# Patient Record
Sex: Female | Born: 1965
Health system: Southern US, Community
[De-identification: ages and names within clinical notes are randomized; demographics above are authoritative.]

## PROBLEM LIST (undated history)

## (undated) DIAGNOSIS — L723 Sebaceous cyst: Secondary | ICD-10-CM

## (undated) DIAGNOSIS — M1990 Unspecified osteoarthritis, unspecified site: Secondary | ICD-10-CM

## (undated) DIAGNOSIS — K5730 Diverticulosis of large intestine without perforation or abscess without bleeding: Secondary | ICD-10-CM

## (undated) DIAGNOSIS — E079 Disorder of thyroid, unspecified: Secondary | ICD-10-CM

## (undated) DIAGNOSIS — I10 Essential (primary) hypertension: Secondary | ICD-10-CM

## (undated) DIAGNOSIS — Z8249 Family history of ischemic heart disease and other diseases of the circulatory system: Secondary | ICD-10-CM

## (undated) DIAGNOSIS — F329 Major depressive disorder, single episode, unspecified: Secondary | ICD-10-CM

## (undated) DIAGNOSIS — T8859XA Other complications of anesthesia, initial encounter: Secondary | ICD-10-CM

## (undated) DIAGNOSIS — E890 Postprocedural hypothyroidism: Secondary | ICD-10-CM

## (undated) DIAGNOSIS — J4540 Moderate persistent asthma, uncomplicated: Secondary | ICD-10-CM

## (undated) DIAGNOSIS — K649 Unspecified hemorrhoids: Secondary | ICD-10-CM

## (undated) DIAGNOSIS — D509 Iron deficiency anemia, unspecified: Secondary | ICD-10-CM

## (undated) DIAGNOSIS — Z973 Presence of spectacles and contact lenses: Secondary | ICD-10-CM

## (undated) DIAGNOSIS — F259 Schizoaffective disorder, unspecified: Secondary | ICD-10-CM

## (undated) DIAGNOSIS — K219 Gastro-esophageal reflux disease without esophagitis: Secondary | ICD-10-CM

## (undated) DIAGNOSIS — J45909 Unspecified asthma, uncomplicated: Secondary | ICD-10-CM

## (undated) DIAGNOSIS — F411 Generalized anxiety disorder: Secondary | ICD-10-CM

## (undated) DIAGNOSIS — E039 Hypothyroidism, unspecified: Secondary | ICD-10-CM

## (undated) HISTORY — PX: SHOULDER SURGERY: SHX246

## (undated) HISTORY — PX: BREAST SURGERY: SHX581

## (undated) HISTORY — PX: BREAST REDUCTION SURGERY: SHX8

## (undated) HISTORY — PX: COLONOSCOPY: SHX174

## (undated) HISTORY — PX: COSMETIC SURGERY: SHX468

## (undated) HISTORY — PX: HERNIA REPAIR: SHX51

## (undated) HISTORY — PX: REDUCTION MAMMAPLASTY: SUR839

## (undated) HISTORY — PX: SHOULDER ARTHROSCOPY: SHX128

## (undated) HISTORY — PX: KNEE ARTHROSCOPY: SUR90

---

## 1986-08-11 HISTORY — PX: BREAST LUMPECTOMY: SHX2

## 1996-08-11 HISTORY — PX: BREAST REDUCTION SURGERY: SHX8

## 1998-05-16 ENCOUNTER — Other Ambulatory Visit: Admission: RE | Admit: 1998-05-16 | Discharge: 1998-05-16 | Payer: Self-pay | Admitting: Family Medicine

## 1999-07-23 ENCOUNTER — Other Ambulatory Visit: Admission: RE | Admit: 1999-07-23 | Discharge: 1999-07-23 | Payer: Self-pay | Admitting: Family Medicine

## 1999-10-14 ENCOUNTER — Ambulatory Visit (HOSPITAL_COMMUNITY): Admission: RE | Admit: 1999-10-14 | Discharge: 1999-10-14 | Payer: Self-pay | Admitting: Family Medicine

## 1999-10-14 ENCOUNTER — Encounter: Payer: Self-pay | Admitting: Family Medicine

## 2000-01-01 ENCOUNTER — Ambulatory Visit (HOSPITAL_COMMUNITY): Admission: RE | Admit: 2000-01-01 | Discharge: 2000-01-01 | Payer: Self-pay | Admitting: *Deleted

## 2000-01-01 ENCOUNTER — Encounter: Payer: Self-pay | Admitting: Orthopedic Surgery

## 2000-01-17 ENCOUNTER — Ambulatory Visit (HOSPITAL_COMMUNITY): Admission: RE | Admit: 2000-01-17 | Discharge: 2000-01-17 | Payer: Self-pay | Admitting: Orthopedic Surgery

## 2000-01-22 ENCOUNTER — Ambulatory Visit (HOSPITAL_COMMUNITY): Admission: RE | Admit: 2000-01-22 | Discharge: 2000-01-22 | Payer: Self-pay | Admitting: Orthopedic Surgery

## 2000-01-22 ENCOUNTER — Encounter: Payer: Self-pay | Admitting: Orthopedic Surgery

## 2000-02-06 ENCOUNTER — Encounter: Payer: Self-pay | Admitting: Internal Medicine

## 2000-02-06 ENCOUNTER — Ambulatory Visit (HOSPITAL_COMMUNITY): Admission: RE | Admit: 2000-02-06 | Discharge: 2000-02-06 | Payer: Self-pay | Admitting: Internal Medicine

## 2000-03-25 ENCOUNTER — Ambulatory Visit (HOSPITAL_COMMUNITY)
Admission: RE | Admit: 2000-03-25 | Discharge: 2000-03-25 | Payer: Self-pay | Admitting: Physical Medicine and Rehabilitation

## 2000-03-25 ENCOUNTER — Encounter: Payer: Self-pay | Admitting: Physical Medicine and Rehabilitation

## 2000-12-17 ENCOUNTER — Encounter: Payer: Self-pay | Admitting: Emergency Medicine

## 2000-12-17 ENCOUNTER — Emergency Department (HOSPITAL_COMMUNITY): Admission: EM | Admit: 2000-12-17 | Discharge: 2000-12-18 | Payer: Self-pay | Admitting: Emergency Medicine

## 2001-03-09 ENCOUNTER — Encounter: Admission: RE | Admit: 2001-03-09 | Discharge: 2001-03-09 | Payer: Self-pay | Admitting: Family Medicine

## 2001-03-09 ENCOUNTER — Encounter: Payer: Self-pay | Admitting: Family Medicine

## 2001-08-11 DIAGNOSIS — Z8639 Personal history of other endocrine, nutritional and metabolic disease: Secondary | ICD-10-CM

## 2001-08-11 HISTORY — DX: Personal history of other endocrine, nutritional and metabolic disease: Z86.39

## 2002-04-27 ENCOUNTER — Encounter: Payer: Self-pay | Admitting: Emergency Medicine

## 2002-04-27 ENCOUNTER — Emergency Department (HOSPITAL_COMMUNITY): Admission: EM | Admit: 2002-04-27 | Discharge: 2002-04-27 | Payer: Self-pay | Admitting: *Deleted

## 2002-07-19 ENCOUNTER — Encounter (HOSPITAL_COMMUNITY): Admission: RE | Admit: 2002-07-19 | Discharge: 2002-10-17 | Payer: Self-pay | Admitting: Endocrinology

## 2002-07-20 ENCOUNTER — Encounter: Payer: Self-pay | Admitting: Endocrinology

## 2002-08-05 ENCOUNTER — Encounter: Payer: Self-pay | Admitting: Endocrinology

## 2004-09-16 ENCOUNTER — Encounter: Admission: RE | Admit: 2004-09-16 | Discharge: 2004-09-16 | Payer: Self-pay | Admitting: Family Medicine

## 2005-09-05 ENCOUNTER — Encounter: Admission: RE | Admit: 2005-09-05 | Discharge: 2005-09-05 | Payer: Self-pay | Admitting: Family Medicine

## 2005-09-05 ENCOUNTER — Other Ambulatory Visit: Admission: RE | Admit: 2005-09-05 | Discharge: 2005-09-05 | Payer: Self-pay | Admitting: Family Medicine

## 2005-09-19 ENCOUNTER — Emergency Department (HOSPITAL_COMMUNITY): Admission: EM | Admit: 2005-09-19 | Discharge: 2005-09-19 | Payer: Self-pay | Admitting: Emergency Medicine

## 2005-10-13 ENCOUNTER — Ambulatory Visit (HOSPITAL_COMMUNITY): Admission: RE | Admit: 2005-10-13 | Discharge: 2005-10-13 | Payer: Self-pay | Admitting: Family Medicine

## 2006-09-22 ENCOUNTER — Encounter: Admission: RE | Admit: 2006-09-22 | Discharge: 2006-09-22 | Payer: Self-pay | Admitting: Endocrinology

## 2006-10-05 ENCOUNTER — Other Ambulatory Visit: Admission: RE | Admit: 2006-10-05 | Discharge: 2006-10-05 | Payer: Self-pay | Admitting: Family Medicine

## 2006-10-19 ENCOUNTER — Ambulatory Visit (HOSPITAL_COMMUNITY): Admission: RE | Admit: 2006-10-19 | Discharge: 2006-10-19 | Payer: Self-pay | Admitting: Family Medicine

## 2007-10-06 ENCOUNTER — Other Ambulatory Visit: Admission: RE | Admit: 2007-10-06 | Discharge: 2007-10-06 | Payer: Self-pay | Admitting: Family Medicine

## 2007-10-20 ENCOUNTER — Ambulatory Visit (HOSPITAL_COMMUNITY): Admission: RE | Admit: 2007-10-20 | Discharge: 2007-10-20 | Payer: Self-pay | Admitting: Family Medicine

## 2007-11-02 ENCOUNTER — Encounter: Admission: RE | Admit: 2007-11-02 | Discharge: 2007-11-02 | Payer: Self-pay | Admitting: Family Medicine

## 2008-08-11 HISTORY — PX: ABDOMINOPLASTY: SUR9

## 2008-08-11 HISTORY — PX: OTHER SURGICAL HISTORY: SHX169

## 2008-10-09 ENCOUNTER — Other Ambulatory Visit: Admission: RE | Admit: 2008-10-09 | Discharge: 2008-10-09 | Payer: Self-pay | Admitting: Family Medicine

## 2008-11-02 ENCOUNTER — Ambulatory Visit (HOSPITAL_COMMUNITY): Admission: RE | Admit: 2008-11-02 | Discharge: 2008-11-02 | Payer: Self-pay | Admitting: Family Medicine

## 2009-02-09 ENCOUNTER — Emergency Department (HOSPITAL_COMMUNITY): Admission: EM | Admit: 2009-02-09 | Discharge: 2009-02-09 | Payer: Self-pay | Admitting: Emergency Medicine

## 2009-11-05 ENCOUNTER — Ambulatory Visit (HOSPITAL_COMMUNITY): Admission: RE | Admit: 2009-11-05 | Discharge: 2009-11-05 | Payer: Self-pay | Admitting: Family Medicine

## 2010-09-01 ENCOUNTER — Encounter: Payer: Self-pay | Admitting: Family Medicine

## 2010-10-01 ENCOUNTER — Other Ambulatory Visit (HOSPITAL_COMMUNITY): Payer: Self-pay | Admitting: Family Medicine

## 2010-10-01 DIAGNOSIS — Z1231 Encounter for screening mammogram for malignant neoplasm of breast: Secondary | ICD-10-CM

## 2010-11-13 ENCOUNTER — Ambulatory Visit (HOSPITAL_COMMUNITY)
Admission: RE | Admit: 2010-11-13 | Discharge: 2010-11-13 | Disposition: A | Discharge status: Home / Self Care | Payer: BC Managed Care – PPO | Source: Ambulatory Visit | Attending: Family Medicine | Admitting: Family Medicine

## 2010-11-13 DIAGNOSIS — Z1231 Encounter for screening mammogram for malignant neoplasm of breast: Secondary | ICD-10-CM

## 2010-11-18 LAB — POCT I-STAT, CHEM 8
Calcium, Ion: 1.25 mmol/L (ref 1.12–1.32)
Creatinine, Ser: 0.9 mg/dL (ref 0.4–1.2)
Glucose, Bld: 105 mg/dL — ABNORMAL HIGH (ref 70–99)
HCT: 39 % (ref 36.0–46.0)
Hemoglobin: 13.3 g/dL (ref 12.0–15.0)
TCO2: 27 mmol/L (ref 0–100)

## 2010-11-18 LAB — CBC
MCHC: 33.1 g/dL (ref 30.0–36.0)
MCV: 83.8 fL (ref 78.0–100.0)
Platelets: 291 10*3/uL (ref 150–400)
RBC: 4.44 MIL/uL (ref 3.87–5.11)
RDW: 12.8 % (ref 11.5–15.5)

## 2010-11-18 LAB — T3, FREE: T3, Free: 2.6 pg/mL (ref 2.3–4.2)

## 2010-11-18 LAB — T4: T4, Total: 9.4 ug/dL (ref 5.0–12.5)

## 2010-12-27 NOTE — Consult Note (Signed)
NAME:  Joy Cook, Joy Cook                        ACCOUNT NO.:  [id number]   MEDICAL RECORD NO.:  [id number]                   PATIENT TYPE:  EMS   LOCATION:  [facility]                                 FACILITY:  [facility]   PHYSICIAN:  Duke Salvia, M.D. [facility]           DATE OF BIRTH:  Dec 10, 1965   DATE OF CONSULTATION:  04/27/2002  DATE OF DISCHARGE:                                   CONSULTATION   REASON FOR CONSULTATION:  I was asked by Lorre Nick, M.D., to evaluate  the patient for palpitations.   HISTORY OF PRESENT ILLNESS:  She is a 45 year old G4, P4, on no hormonal  therapy, who presents to the emergency room with a six-month history of  recurrent palpitations as well as progressive shortness of breath.  The  palpitations had been notable both at rest as well as with exertion; she  mostly feels them as if there is a thump, but occasionally they are more  rapid.  She has also noted progressive shortness of breath so that two  flights of stairs at work are more difficult to maneuver.   Today while sitting at her desk, she had a significant increase in her  palpitations and shortness of breath, and she presented to the emergency  room.  Evaluation in the emergency room has included a normal chest x-ray, a  normal D-dimer, normal enzymes, otherwise normal lab work, although a  thyroid has not yet been drawn, and a normal ABG with an A/A gradient  essentially of 0.   She has lost 13 pounds by dieting over the last couple of months.  She has  used [employer] but no stimulants.  She does not use caffeine.  She had a  diagnosis of bronchopneumonia, and she was given inhalers as well as  albuterol for this.  This was associated with significant jitters, and she  decreased the intake of them and has not been taking them recently.  She  does not use over-the-counter cold medicines.  She does use some caffeine.   She has no known cardiac history.   The patient notes problems with heat  tolerance and increase in sweatiness;  there has been no change in bowel function, skin or hair texture.   PAST MEDICAL HISTORY:  Notable for asthma.   PAST SURGICAL HISTORY:  Notable for bilateral tubal.   SOCIAL HISTORY:  She lives with her husband.  She has four teenage children,  and she is not insignificantly stressed by all of this.   REVIEW OF SYMPTOMS:  Otherwise noncontributory.   PHYSICAL EXAMINATION:  GENERAL:  She is a modestly obese, middle-aged  African-American female in no acute distress.  VITAL SIGNS:  Her blood pressure is 135/74, her pulse is 100 and irregular.  HEENT:  No scleral icterus, no xanthomata.  NECK:  The neck veins were flat.  The carotids were brisk and full  bilaterally without bruits.  No  thyromegaly was appreciated.  BACK:  Without kyphosis or scoliosis.  CHEST:  The lungs were clear.  CARDIAC:  Heart sounds were irregular without murmurs or gallops.  ABDOMEN:  Soft with active bowel sounds, without midline pulsation or  hepatomegaly.  EXTREMITIES:  Femoral pulses were 2+.  Distal pulses were intact.  There was  no clubbing, cyanosis, or edema.  NEUROLOGIC:  Notable for brisk reflexes.   LABORATORY DATA:  Notable for normal hemogram, BMET, and negative cardiac  laboratories.  Thyroids are pending at the present time.   Electrocardiogram demonstrated sinus rhythm at 112 with intervals of  0.12/0.08/0.30 with frequent PACs.  There was also on telemetry strips a  beat which was difficult to discern on the one lead recorded but on the two-  lead on the screen clearly appeared to be interpolated PACs with some  aberration.   IMPRESSION:  1. Frequent and complex atrial ectopy.  2. Progressive shortness of breath, both of which have been progressive over     the last six months.  3. Symptoms suggestive of hyperthyroidism.  4. Asthma.  5. Modest obesity.   The patient has frequent and complex atrial ectopy.  I am not quite sure why  it is that  this is occurring, though it has clearly been progressive over  many months.  It is possible that there is progressive hyperthyroidism;  alternatively, there could have been a shield myocarditis with atrial  irritability.  The shortness of breath may or may not be related to  myocardial dysfunction; neuroendocrine effects from atrial arrhythmias and  post-systolic potentiation or post-extrasystolic potentiation could  certainly be contributing as well.   RECOMMENDATIONS:  Therefore, based on the above, will:   1. Begin her on a beta blocker.  I will give her prescriptions today for     Inderal LA 80 mg, atenolol 50 mg, and Toprol 50 mg, and she will take     them in whatever order she pleases to see whether:  (a) She can tolerate     it from a side effect profile; and (b) whether there is any improvement     in her symptoms.  2. We will obtain a 2 D echo and check her TSH.  3. I will plan to see her in three or four weeks' time, and she is advised     to call me if there are any increasing symptoms.                                               Duke Salvia, M.D. [facility]    SCK/MEDQ  D:  04/27/2002  T:  04/28/2002  Job:  [id number]   cc:   Sadie Haber [facility]

## 2011-03-22 ENCOUNTER — Emergency Department (HOSPITAL_COMMUNITY): Payer: BC Managed Care – PPO

## 2011-03-22 ENCOUNTER — Emergency Department (HOSPITAL_COMMUNITY)
Admission: EM | Admit: 2011-03-22 | Discharge: 2011-03-22 | Disposition: A | Discharge status: Home / Self Care | Payer: BC Managed Care – PPO | Attending: Emergency Medicine | Admitting: Emergency Medicine

## 2011-03-22 DIAGNOSIS — Z23 Encounter for immunization: Secondary | ICD-10-CM | POA: Insufficient documentation

## 2011-03-22 DIAGNOSIS — M79609 Pain in unspecified limb: Secondary | ICD-10-CM | POA: Insufficient documentation

## 2011-03-22 DIAGNOSIS — W268XXA Contact with other sharp object(s), not elsewhere classified, initial encounter: Secondary | ICD-10-CM | POA: Insufficient documentation

## 2011-03-22 DIAGNOSIS — IMO0002 Reserved for concepts with insufficient information to code with codable children: Secondary | ICD-10-CM | POA: Insufficient documentation

## 2012-01-07 ENCOUNTER — Other Ambulatory Visit (HOSPITAL_COMMUNITY)
Admission: RE | Admit: 2012-01-07 | Discharge: 2012-01-07 | Disposition: A | Discharge status: Home / Self Care | Payer: BC Managed Care – PPO | Source: Ambulatory Visit | Attending: Family Medicine | Admitting: Family Medicine

## 2012-01-07 ENCOUNTER — Other Ambulatory Visit: Payer: Self-pay | Admitting: Family Medicine

## 2012-01-07 DIAGNOSIS — Z01419 Encounter for gynecological examination (general) (routine) without abnormal findings: Secondary | ICD-10-CM | POA: Insufficient documentation

## 2013-01-27 ENCOUNTER — Other Ambulatory Visit (HOSPITAL_COMMUNITY): Payer: Self-pay | Admitting: Family Medicine

## 2013-01-27 DIAGNOSIS — Z1231 Encounter for screening mammogram for malignant neoplasm of breast: Secondary | ICD-10-CM

## 2013-02-16 ENCOUNTER — Ambulatory Visit (HOSPITAL_COMMUNITY)
Admission: RE | Admit: 2013-02-16 | Discharge: 2013-02-16 | Disposition: A | Discharge status: Home / Self Care | Payer: BC Managed Care – PPO | Source: Ambulatory Visit | Attending: Family Medicine | Admitting: Family Medicine

## 2013-02-16 DIAGNOSIS — Z1231 Encounter for screening mammogram for malignant neoplasm of breast: Secondary | ICD-10-CM

## 2013-02-17 ENCOUNTER — Other Ambulatory Visit: Payer: Self-pay | Admitting: Family Medicine

## 2013-02-17 DIAGNOSIS — R928 Other abnormal and inconclusive findings on diagnostic imaging of breast: Secondary | ICD-10-CM

## 2013-03-02 ENCOUNTER — Ambulatory Visit
Admission: RE | Admit: 2013-03-02 | Discharge: 2013-03-02 | Disposition: A | Discharge status: Home / Self Care | Payer: BC Managed Care – PPO | Source: Ambulatory Visit | Attending: Family Medicine | Admitting: Family Medicine

## 2013-03-02 DIAGNOSIS — R928 Other abnormal and inconclusive findings on diagnostic imaging of breast: Secondary | ICD-10-CM

## 2013-03-28 ENCOUNTER — Other Ambulatory Visit: Payer: Self-pay | Admitting: Family Medicine

## 2013-03-28 ENCOUNTER — Other Ambulatory Visit (HOSPITAL_COMMUNITY)
Admission: RE | Admit: 2013-03-28 | Discharge: 2013-03-28 | Disposition: A | Discharge status: Home / Self Care | Payer: BC Managed Care – PPO | Source: Ambulatory Visit | Attending: Family Medicine | Admitting: Family Medicine

## 2013-03-28 DIAGNOSIS — Z124 Encounter for screening for malignant neoplasm of cervix: Secondary | ICD-10-CM | POA: Insufficient documentation

## 2013-03-28 DIAGNOSIS — Z1151 Encounter for screening for human papillomavirus (HPV): Secondary | ICD-10-CM | POA: Insufficient documentation

## 2013-07-28 ENCOUNTER — Other Ambulatory Visit: Payer: Self-pay | Admitting: Family Medicine

## 2013-07-28 DIAGNOSIS — N6489 Other specified disorders of breast: Secondary | ICD-10-CM

## 2013-07-28 DIAGNOSIS — R922 Inconclusive mammogram: Secondary | ICD-10-CM

## 2013-07-29 ENCOUNTER — Emergency Department (HOSPITAL_COMMUNITY)
Admission: EM | Admit: 2013-07-29 | Discharge: 2013-07-30 | Disposition: A | Discharge status: Home / Self Care | Payer: BC Managed Care – PPO | Attending: Emergency Medicine | Admitting: Emergency Medicine

## 2013-07-29 ENCOUNTER — Encounter (HOSPITAL_COMMUNITY): Payer: Self-pay | Admitting: Emergency Medicine

## 2013-07-29 ENCOUNTER — Emergency Department (HOSPITAL_COMMUNITY): Payer: BC Managed Care – PPO

## 2013-07-29 DIAGNOSIS — R079 Chest pain, unspecified: Secondary | ICD-10-CM

## 2013-07-29 DIAGNOSIS — E079 Disorder of thyroid, unspecified: Secondary | ICD-10-CM | POA: Insufficient documentation

## 2013-07-29 DIAGNOSIS — Z792 Long term (current) use of antibiotics: Secondary | ICD-10-CM | POA: Insufficient documentation

## 2013-07-29 DIAGNOSIS — Z79899 Other long term (current) drug therapy: Secondary | ICD-10-CM | POA: Insufficient documentation

## 2013-07-29 DIAGNOSIS — Z9889 Other specified postprocedural states: Secondary | ICD-10-CM | POA: Insufficient documentation

## 2013-07-29 DIAGNOSIS — IMO0002 Reserved for concepts with insufficient information to code with codable children: Secondary | ICD-10-CM | POA: Insufficient documentation

## 2013-07-29 HISTORY — DX: Disorder of thyroid, unspecified: E07.9

## 2013-07-29 LAB — CBC
HCT: 38.6 % (ref 36.0–46.0)
Hemoglobin: 13 g/dL (ref 12.0–15.0)
MCHC: 33.7 g/dL (ref 30.0–36.0)
RBC: 4.8 MIL/uL (ref 3.87–5.11)
RDW: 13 % (ref 11.5–15.5)
WBC: 12.7 10*3/uL — ABNORMAL HIGH (ref 4.0–10.5)

## 2013-07-29 LAB — BASIC METABOLIC PANEL
BUN: 12 mg/dL (ref 6–23)
Chloride: 101 mEq/L (ref 96–112)
Creatinine, Ser: 0.73 mg/dL (ref 0.50–1.10)
GFR calc Af Amer: >90 mL/min (ref >90)
GFR calc non Af Amer: >90 mL/min (ref >90)
Potassium: 3.9 mEq/L (ref 3.5–5.1)
Sodium: 134 mEq/L — ABNORMAL LOW (ref 135–145)

## 2013-07-29 LAB — POCT I-STAT TROPONIN I: Troponin i, poc: 0 ng/mL (ref 0.00–0.08)

## 2013-07-29 MED ORDER — IBUPROFEN 800 MG PO TABS
800.0000 mg | ORAL_TABLET | Freq: Once | ORAL | Status: AC
  Administered 2013-07-30: 800 mg via ORAL
  Filled 2013-07-29: qty 1

## 2013-07-29 MED ORDER — GI COCKTAIL ~~LOC~~
30.0000 mL | Freq: Once | ORAL | Status: AC
  Administered 2013-07-29: 30 mL via ORAL
  Filled 2013-07-29: qty 30

## 2013-07-29 MED ORDER — PANTOPRAZOLE SODIUM 20 MG PO TBEC: 20.0000 mg | DELAYED_RELEASE_TABLET | Freq: Every day | ORAL | Status: DC

## 2013-07-29 MED ORDER — PANTOPRAZOLE SODIUM 40 MG PO TBEC
40.0000 mg | DELAYED_RELEASE_TABLET | Freq: Once | ORAL | Status: AC
  Administered 2013-07-30: 40 mg via ORAL
  Filled 2013-07-29: qty 1

## 2013-07-29 NOTE — ED Provider Notes (Signed)
CSN: [id number]     Arrival date & time 07/29/13  2105 History   First MD Initiated Contact with Patient 07/29/13 2130     Chief Complaint  Patient presents with  . Chest Pain   (Consider location/radiation/quality/duration/timing/severity/associated sxs/prior Treatment) Patient is a 47 y.o. female presenting with chest pain. The history is provided by the patient.  Chest Pain Pain location:  R chest Pain quality: pressure   Pain radiates to:  Neck Pain radiates to the back: no   Pain severity:  Mild Duration:  1 day Timing:  Intermittent Progression:  Worsening Chronicity:  New Context: movement and raising an arm   Context: not eating, no stress and no trauma   Relieved by:  Rest Worsened by:  Movement Ineffective treatments:  None tried Associated symptoms: no abdominal pain, no cough, no fever, no shortness of breath and not vomiting     Past Medical History  Diagnosis Date  . Thyroid disease    Past Surgical History  Procedure Laterality Date  . Breast surgery    . Cesarean section    . Shoulder surgery Left   . Hernia repair    . Cosmetic surgery     No family history on file. History  Substance Use Topics  . Smoking status: Never Smoker   . Smokeless tobacco: Not on file  . Alcohol Use: No   OB History   Grav Para Term Preterm Abortions TAB SAB Ect Mult Living                 Review of Systems  Constitutional: Negative for fever and chills.  Respiratory: Negative for cough and shortness of breath.   Cardiovascular: Positive for chest pain. Negative for leg swelling.  Gastrointestinal: Negative for vomiting, abdominal pain and diarrhea.  All other systems reviewed and are negative.    Allergies  Hydrocodone and Shellfish allergy  Home Medications   Current Outpatient Rx  Name  Route  Sig  Dispense  Refill  . amoxicillin (AMOXIL) 500 MG capsule   Oral   Take 500 mg by mouth 2 (two) times daily.         Marland Kitchen levothyroxine (SYNTHROID,  LEVOTHROID) 88 MCG tablet   Oral   Take 88 mcg by mouth daily before breakfast.         . metaxalone (SKELAXIN) 800 MG tablet   Oral   Take 800 mg by mouth 2 (two) times daily.         . naproxen sodium (ANAPROX) 220 MG tablet   Oral   Take 220 mg by mouth 2 (two) times daily as needed (pain).         . predniSONE (DELTASONE) 50 MG tablet   Oral   Take 50 mg by mouth daily with breakfast.          BP 155/88  Pulse 85  Temp(Src) 99.1 F (37.3 C) (Oral)  Resp 16  Ht 5\' 2"  (1.575 m)  Wt 170 lb (77.111 kg)  BMI 31.09 kg/m2  SpO2 100%  LMP 07/12/2013 Physical Exam  Nursing note and vitals reviewed. Constitutional: She is oriented to person, place, and time. She appears well-developed and well-nourished. No distress.  HENT:  Head: Normocephalic and atraumatic.  Eyes: EOM are normal. Pupils are equal, round, and reactive to light.  Neck: Normal range of motion. Neck supple.  Cardiovascular: Normal rate and regular rhythm.  Exam reveals no friction rub.   No murmur heard. Pulmonary/Chest: Effort normal and breath  sounds normal. No respiratory distress. She has no wheezes. She has no rales. She exhibits tenderness (central and R chest).  Abdominal: Soft. She exhibits no distension. There is no tenderness. There is no rebound.  Musculoskeletal: Normal range of motion. She exhibits no edema.  Neurological: She is alert and oriented to person, place, and time.  Skin: She is not diaphoretic.    ED Course  Procedures (including critical care time) Labs Review Labs Reviewed  CBC  BASIC METABOLIC PANEL   Imaging Review No results found.  EKG Interpretation    Date/Time:  Friday July 29 2013 21:21:48 EST Ventricular Rate:  80 PR Interval:  118 QRS Duration: 74 QT Interval:  339 QTC Calculation: 391 R Axis:   -3 Text Interpretation:  Sinus rhythm Borderline short PR interval Confirmed by Gwendolyn Grant  MD, Maurya Nethery ([id number]) on 07/29/2013 9:32:28 PM            MDM    1. Chest pain    14F presents with chest pain. Began yesterday while working. She sits at a desk and working. Pain has been intermittent throughout the day. She states it's been more constant since lunch though. She reports it's a pressure pain radiating to her right shoulder. Worse with moving her neck. She will occasionally radiate to her back. She is chest pain-free at this time but would move around the bed sits the pain comes back. She states she feels short of breath when she is talking. She denies any fevers, nausea, vomiting. States she's been occasionally dizzy with this pain. She only reports pain with movements, not exertion. No dyspnea on exertion.  Here vitals are stable. She is not tachycardic. She is PERC negative. She has palpable chest tenderness, lower sternum on the right pectoralis muscles. I feel like her pain is atypical for ACS and likely musculoskeletal or related to acid reflux as it is worse when she lies down and improved with a GI cocktail. Her initial EKG is normal. Her initial troponin is normal. We'll repeat a second troponin. Her chest x-ray is normal. Instructed to f/u with PCP for stress test next week.     Dagmar Hait, MD 07/30/13 0001

## 2013-07-29 NOTE — ED Notes (Signed)
Pt c/o substernal chest pressure with radiation to R chest onset yesterday while working, +SHOB, +nausea,+diarrhea +lightheadedness.

## 2013-07-30 LAB — POCT I-STAT TROPONIN I: Troponin i, poc: 0.01 ng/mL (ref 0.00–0.08)

## 2013-08-18 ENCOUNTER — Ambulatory Visit
Admission: RE | Admit: 2013-08-18 | Discharge: 2013-08-18 | Disposition: A | Discharge status: Home / Self Care | Payer: BC Managed Care – PPO | Source: Ambulatory Visit | Attending: Family Medicine | Admitting: Family Medicine

## 2013-08-18 ENCOUNTER — Other Ambulatory Visit: Payer: Self-pay | Admitting: Family Medicine

## 2013-08-18 DIAGNOSIS — N6489 Other specified disorders of breast: Secondary | ICD-10-CM

## 2013-08-18 DIAGNOSIS — R922 Inconclusive mammogram: Secondary | ICD-10-CM

## 2013-08-29 ENCOUNTER — Ambulatory Visit
Admission: RE | Admit: 2013-08-29 | Discharge: 2013-08-29 | Disposition: A | Discharge status: Home / Self Care | Payer: BC Managed Care – PPO | Source: Ambulatory Visit | Attending: Family Medicine | Admitting: Family Medicine

## 2013-08-29 DIAGNOSIS — R922 Inconclusive mammogram: Secondary | ICD-10-CM

## 2013-08-29 DIAGNOSIS — N6489 Other specified disorders of breast: Secondary | ICD-10-CM

## 2013-12-20 ENCOUNTER — Other Ambulatory Visit: Payer: Self-pay | Admitting: Family Medicine

## 2013-12-20 DIAGNOSIS — N939 Abnormal uterine and vaginal bleeding, unspecified: Secondary | ICD-10-CM

## 2013-12-21 ENCOUNTER — Ambulatory Visit
Admission: RE | Admit: 2013-12-21 | Discharge: 2013-12-21 | Disposition: A | Discharge status: Home / Self Care | Payer: BC Managed Care – PPO | Source: Ambulatory Visit | Attending: Family Medicine | Admitting: Family Medicine

## 2013-12-21 DIAGNOSIS — N939 Abnormal uterine and vaginal bleeding, unspecified: Secondary | ICD-10-CM

## 2014-01-10 ENCOUNTER — Other Ambulatory Visit: Payer: Self-pay

## 2014-01-10 ENCOUNTER — Other Ambulatory Visit (HOSPITAL_COMMUNITY): Payer: Self-pay | Admitting: Family Medicine

## 2014-01-10 DIAGNOSIS — Z1231 Encounter for screening mammogram for malignant neoplasm of breast: Secondary | ICD-10-CM

## 2014-02-01 ENCOUNTER — Other Ambulatory Visit: Payer: Self-pay | Admitting: Obstetrics and Gynecology

## 2014-02-17 ENCOUNTER — Encounter (INDEPENDENT_AMBULATORY_CARE_PROVIDER_SITE_OTHER): Payer: Self-pay

## 2014-02-17 ENCOUNTER — Ambulatory Visit
Admission: RE | Admit: 2014-02-17 | Discharge: 2014-02-17 | Disposition: A | Discharge status: Home / Self Care | Payer: BC Managed Care – PPO | Source: Ambulatory Visit

## 2014-02-17 DIAGNOSIS — Z1231 Encounter for screening mammogram for malignant neoplasm of breast: Secondary | ICD-10-CM

## 2014-04-21 ENCOUNTER — Encounter (HOSPITAL_COMMUNITY): Payer: Self-pay

## 2014-04-24 ENCOUNTER — Encounter (HOSPITAL_COMMUNITY): Payer: Self-pay | Admitting: *Deleted

## 2014-05-03 ENCOUNTER — Encounter (HOSPITAL_COMMUNITY): Payer: Self-pay | Admitting: Certified Registered"

## 2014-05-03 ENCOUNTER — Ambulatory Visit (HOSPITAL_COMMUNITY): Payer: Federal, State, Local not specified - PPO | Admitting: Anesthesiology

## 2014-05-03 ENCOUNTER — Encounter (HOSPITAL_COMMUNITY): Payer: Federal, State, Local not specified - PPO | Admitting: Anesthesiology

## 2014-05-03 ENCOUNTER — Ambulatory Visit (HOSPITAL_COMMUNITY)
Admission: RE | Admit: 2014-05-03 | Discharge: 2014-05-03 | Disposition: A | Discharge status: Home / Self Care | Payer: Federal, State, Local not specified - PPO | Source: Ambulatory Visit | Attending: Obstetrics and Gynecology | Admitting: Obstetrics and Gynecology

## 2014-05-03 ENCOUNTER — Encounter (HOSPITAL_COMMUNITY)
Admission: RE | Disposition: A | Discharge status: Home / Self Care | Payer: Self-pay | Source: Ambulatory Visit | Attending: Obstetrics and Gynecology

## 2014-05-03 DIAGNOSIS — N938 Other specified abnormal uterine and vaginal bleeding: Secondary | ICD-10-CM | POA: Diagnosis present

## 2014-05-03 DIAGNOSIS — K219 Gastro-esophageal reflux disease without esophagitis: Secondary | ICD-10-CM | POA: Diagnosis not present

## 2014-05-03 DIAGNOSIS — J45909 Unspecified asthma, uncomplicated: Secondary | ICD-10-CM | POA: Insufficient documentation

## 2014-05-03 DIAGNOSIS — D259 Leiomyoma of uterus, unspecified: Secondary | ICD-10-CM | POA: Insufficient documentation

## 2014-05-03 DIAGNOSIS — N949 Unspecified condition associated with female genital organs and menstrual cycle: Secondary | ICD-10-CM | POA: Insufficient documentation

## 2014-05-03 DIAGNOSIS — E039 Hypothyroidism, unspecified: Secondary | ICD-10-CM | POA: Insufficient documentation

## 2014-05-03 DIAGNOSIS — N925 Other specified irregular menstruation: Secondary | ICD-10-CM | POA: Diagnosis present

## 2014-05-03 HISTORY — DX: Hypothyroidism, unspecified: E03.9

## 2014-05-03 HISTORY — PX: DILATATION & CURRETTAGE/HYSTEROSCOPY WITH RESECTOCOPE: SHX5572

## 2014-05-03 HISTORY — DX: Gastro-esophageal reflux disease without esophagitis: K21.9

## 2014-05-03 HISTORY — DX: Unspecified asthma, uncomplicated: J45.909

## 2014-05-03 LAB — CBC
HCT: 36.3 % (ref 36.0–46.0)
HEMOGLOBIN: 11.9 g/dL — AB (ref 12.0–15.0)
MCH: 26 pg (ref 26.0–34.0)
MCHC: 32.8 g/dL (ref 30.0–36.0)
MCV: 79.4 fL (ref 78.0–100.0)
PLATELETS: 274 10*3/uL (ref 150–400)
RBC: 4.57 MIL/uL (ref 3.87–5.11)
RDW: 13.5 % (ref 11.5–15.5)
WBC: 7.7 10*3/uL (ref 4.0–10.5)

## 2014-05-03 LAB — PREGNANCY, URINE: Preg Test, Ur: NEGATIVE

## 2014-05-03 SURGERY — DILATATION & CURETTAGE/HYSTEROSCOPY WITH RESECTOCOPE
Anesthesia: General | Site: Vagina

## 2014-05-03 MED ORDER — DEXAMETHASONE SODIUM PHOSPHATE 10 MG/ML IJ SOLN
INTRAMUSCULAR | Status: AC
  Filled 2014-05-03: qty 1

## 2014-05-03 MED ORDER — SODIUM CHLORIDE 0.9 % IR SOLN
Status: DC | PRN
  Administered 2014-05-03: 3000 mL

## 2014-05-03 MED ORDER — LIDOCAINE HCL 2 % IJ SOLN
INTRAMUSCULAR | Status: DC | PRN
  Administered 2014-05-03: 7 mL

## 2014-05-03 MED ORDER — MIDAZOLAM HCL 2 MG/2ML IJ SOLN
INTRAMUSCULAR | Status: DC | PRN
  Administered 2014-05-03: 2 mg via INTRAVENOUS

## 2014-05-03 MED ORDER — DEXTROSE 5 % IV SOLN
2.0000 g | INTRAVENOUS | Status: AC
  Administered 2014-05-03: 2 g via INTRAVENOUS
  Filled 2014-05-03: qty 2

## 2014-05-03 MED ORDER — GLYCINE 1.5 % IR SOLN
Status: DC | PRN
  Administered 2014-05-03: 3000 mL

## 2014-05-03 MED ORDER — LIDOCAINE HCL 2 % IJ SOLN
INTRAMUSCULAR | Status: AC
  Filled 2014-05-03: qty 20

## 2014-05-03 MED ORDER — LIDOCAINE HCL (CARDIAC) 20 MG/ML IV SOLN
INTRAVENOUS | Status: AC
  Filled 2014-05-03: qty 5

## 2014-05-03 MED ORDER — PROPOFOL 10 MG/ML IV BOLUS
INTRAVENOUS | Status: DC | PRN
  Administered 2014-05-03: 200 mg via INTRAVENOUS

## 2014-05-03 MED ORDER — IBUPROFEN 600 MG PO TABS: 600.0000 mg | ORAL_TABLET | Freq: Four times a day (QID) | ORAL | Status: DC | PRN

## 2014-05-03 MED ORDER — ACETAMINOPHEN 160 MG/5ML PO SOLN
ORAL | Status: AC
  Administered 2014-05-03: 950 mg via ORAL
  Filled 2014-05-03: qty 40.6

## 2014-05-03 MED ORDER — PROPOFOL 10 MG/ML IV EMUL
INTRAVENOUS | Status: AC
  Filled 2014-05-03: qty 20

## 2014-05-03 MED ORDER — TRAMADOL HCL 50 MG PO TABS: 25.0000 mg | ORAL_TABLET | Freq: Four times a day (QID) | ORAL | Status: DC | PRN

## 2014-05-03 MED ORDER — LACTATED RINGERS IV SOLN
INTRAVENOUS | Status: DC
  Administered 2014-05-03 (×2): via INTRAVENOUS

## 2014-05-03 MED ORDER — KETOROLAC TROMETHAMINE 30 MG/ML IJ SOLN
INTRAMUSCULAR | Status: AC
  Filled 2014-05-03: qty 1

## 2014-05-03 MED ORDER — LIDOCAINE HCL (CARDIAC) 20 MG/ML IV SOLN
INTRAVENOUS | Status: DC | PRN
  Administered 2014-05-03: 80 mg via INTRAVENOUS

## 2014-05-03 MED ORDER — DEXAMETHASONE SODIUM PHOSPHATE 10 MG/ML IJ SOLN
INTRAMUSCULAR | Status: DC | PRN
  Administered 2014-05-03: 4 mg via INTRAVENOUS

## 2014-05-03 MED ORDER — ONDANSETRON HCL 4 MG/2ML IJ SOLN
INTRAMUSCULAR | Status: AC
  Filled 2014-05-03: qty 2

## 2014-05-03 MED ORDER — FENTANYL CITRATE 0.05 MG/ML IJ SOLN
INTRAMUSCULAR | Status: AC
  Filled 2014-05-03: qty 2

## 2014-05-03 MED ORDER — ACETAMINOPHEN 160 MG/5ML PO SOLN
950.0000 mg | Freq: Four times a day (QID) | ORAL | Status: DC | PRN
  Administered 2014-05-03: 950 mg via ORAL

## 2014-05-03 MED ORDER — DIPHENHYDRAMINE HCL 50 MG/ML IJ SOLN
INTRAMUSCULAR | Status: AC
  Filled 2014-05-03: qty 1

## 2014-05-03 MED ORDER — ONDANSETRON HCL 4 MG/2ML IJ SOLN
INTRAMUSCULAR | Status: DC | PRN
  Administered 2014-05-03: 4 mg via INTRAVENOUS

## 2014-05-03 MED ORDER — FENTANYL CITRATE 0.05 MG/ML IJ SOLN
INTRAMUSCULAR | Status: DC | PRN
  Administered 2014-05-03: 50 ug via INTRAVENOUS

## 2014-05-03 MED ORDER — SCOPOLAMINE 1 MG/3DAYS TD PT72
1.0000 | MEDICATED_PATCH | Freq: Once | TRANSDERMAL | Status: DC
  Administered 2014-05-03: 1.5 mg via TRANSDERMAL

## 2014-05-03 MED ORDER — MIDAZOLAM HCL 2 MG/2ML IJ SOLN
INTRAMUSCULAR | Status: AC
  Filled 2014-05-03: qty 2

## 2014-05-03 MED ORDER — KETOROLAC TROMETHAMINE 30 MG/ML IJ SOLN
INTRAMUSCULAR | Status: DC | PRN
Start: 2014-05-03 — End: 2014-05-03
  Administered 2014-05-03: 30 mg via INTRAVENOUS

## 2014-05-03 MED ORDER — SCOPOLAMINE 1 MG/3DAYS TD PT72
MEDICATED_PATCH | TRANSDERMAL | Status: AC
  Filled 2014-05-03: qty 1

## 2014-05-03 SURGICAL SUPPLY — 17 items
CANISTER SUCT 3000ML (MISCELLANEOUS) ×3 IMPLANT
CATH ROBINSON RED A/P 16FR (CATHETERS) ×3 IMPLANT
CLOTH BEACON ORANGE TIMEOUT ST (SAFETY) ×3 IMPLANT
CONTAINER PREFILL 10% NBF 60ML (FORM) ×6 IMPLANT
DILATOR CANAL MILEX (MISCELLANEOUS) ×3 IMPLANT
DRAPE HYSTEROSCOPY (DRAPE) ×3 IMPLANT
GLOVE BIO SURGEON STRL SZ7 (GLOVE) ×3 IMPLANT
GLOVE BIOGEL PI IND STRL 7.0 (GLOVE) ×2 IMPLANT
GLOVE BIOGEL PI INDICATOR 7.0 (GLOVE) ×1
GOWN STRL REUS W/TWL LRG LVL3 (GOWN DISPOSABLE) ×6 IMPLANT
LOOP ANGLED CUTTING 22FR (CUTTING LOOP) ×3 IMPLANT
PACK VAGINAL MINOR WOMEN LF (CUSTOM PROCEDURE TRAY) ×3 IMPLANT
PAD OB MATERNITY 4.3X12.25 (PERSONAL CARE ITEMS) ×3 IMPLANT
SET TUBING HYSTEROSCOPY 2 NDL (TUBING) ×3 IMPLANT
TOWEL OR 17X24 6PK STRL BLUE (TOWEL DISPOSABLE) ×6 IMPLANT
TUBE HYSTEROSCOPY W Y-CONNECT (TUBING) ×3 IMPLANT
WATER STERILE IRR 1000ML POUR (IV SOLUTION) ×3 IMPLANT

## 2014-05-03 NOTE — Discharge Instructions (Signed)
Dilation and Curettage or Vacuum Curettage, Care After °Refer to this sheet in the next few weeks. These instructions provide you with information on caring for yourself after your procedure. Your health care provider may also give you more specific instructions. Your treatment has been planned according to current medical practices, but problems sometimes occur. Call your health care provider if you have any problems or questions after your procedure. °WHAT TO EXPECT AFTER THE PROCEDURE °After your procedure, it is typical to have light cramping and bleeding. This may last for 2 days to 2 weeks after the procedure. °HOME CARE INSTRUCTIONS  °· Do not drive for 24 hours. °· Wait 1 week before returning to strenuous activities. °· Take your temperature 2 times a day for 4 days and write it down. Provide these temperatures to your health care provider if you develop a fever. °· Avoid long periods of standing. °· Avoid heavy lifting, pushing, or pulling. Do not lift anything heavier than 10 pounds (4.5 kg). °· Limit stair climbing to once or twice a day. °· Take rest periods often. °· You may resume your usual diet. °· Drink enough fluids to keep your urine clear or pale yellow. °· Your usual bowel function should return. If you have constipation, you may: °¨ Take a mild laxative with permission from your health care provider. °¨ Add fruit and bran to your diet. °¨ Drink more fluids. °· Take showers instead of baths until your health care provider gives you permission to take baths. °· Do not go swimming or use a hot tub until your health care provider approves. °· Try to have someone with you or available to you the first 24-48 hours, especially if you were given a general anesthetic. °· Do not douche, use tampons, or have sex (intercourse) for 2 weeks after the procedure. °· Only take over-the-counter or prescription medicines as directed by your health care provider. Do not take aspirin. It can cause  bleeding. °· Follow up with your health care provider as directed. °SEEK MEDICAL CARE IF:  °· You have increasing cramps or pain that is not relieved with medicine. °· You have abdominal pain that does not seem to be related to the same area of earlier cramping and pain. °· You have bad smelling vaginal discharge. °· You have a rash. °· You are having problems with any medicine. °SEEK IMMEDIATE MEDICAL CARE IF:  °· You have bleeding that is heavier than a normal menstrual period. °· You have a fever. °· You have chest pain. °· You have shortness of breath. °· You feel dizzy or feel like fainting. °· You pass out. °· You have pain in your shoulder strap area. °· You have heavy vaginal bleeding with or without blood clots. °MAKE SURE YOU:  °· Understand these instructions. °· Will watch your condition. °· Will get help right away if you are not doing well or get worse. °Document Released: 07/25/2000 Document Revised: 08/02/2013 Document Reviewed: 02/24/2013 °[employer]® Patient Information ©2015 [employer]. This information is not intended to replace advice given to you by your health care provider. Make sure you discuss any questions you have with your health care provider. °DISCHARGE INSTRUCTIONS: D&C / D&E °The following instructions have been prepared to help you care for yourself upon your return home. °  °Personal hygiene: °• Use sanitary pads for vaginal drainage, not tampons. °• Shower the day after your procedure. °• NO tub baths, pools or Jacuzzis for 2-3 weeks. °• Wipe front to back   after using the bathroom. ° °Activity and limitations: °• Do NOT drive or operate any equipment for 24 hours. The effects of anesthesia are still present and drowsiness may result. °• Do NOT rest in bed all day. °• Walking is encouraged. °• Walk up and down stairs slowly. °• You may resume your normal activity in one to two days or as indicated by your physician. ° °Sexual activity: NO intercourse for at least 2 weeks after the  procedure, or as indicated by your physician. ° °Diet: Eat a light meal as desired this evening. You may resume your usual diet tomorrow. ° °Return to work: You may resume your work activities in one to two days or as indicated by your doctor. ° °What to expect after your surgery: Expect to have vaginal bleeding/discharge for 2-3 days and spotting for up to 10 days. It is not unusual to have soreness for up to 1-2 weeks. You may have a slight burning sensation when you urinate for the first day. Mild cramps may continue for a couple of days. You may have a regular period in 2-6 weeks. ° °Call your doctor for any of the following: °• Excessive vaginal bleeding, saturating and changing one pad every hour. °• Inability to urinate 6 hours after discharge from hospital. °• Pain not relieved by pain medication. °• Fever of 100.4° F or greater. °• Unusual vaginal discharge or odor. ° ° Call for an appointment:  ° ° °Patient’s signature: ______________________ ° °Nurse’s signature ________________________ ° °Support person's signature_______________________ ° ° ° °

## 2014-05-03 NOTE — Transfer of Care (Signed)
Immediate Anesthesia Transfer of Care Note  Patient: Joy Cook  Procedure(s) Performed: Procedure(s): DILATATION & CURETTAGE/HYSTEROSCOPY WITH RESECTOCOPE (N/A)  Patient Location: PACU  Anesthesia Type:General  Level of Consciousness: awake, alert  and oriented  Airway & Oxygen Therapy: Patient Spontanous Breathing and Patient connected to nasal cannula oxygen  Post-op Assessment: Report given to PACU RN, Post -op Vital signs reviewed and stable and Patient moving all extremities  Post vital signs: Reviewed and stable  Complications: No apparent anesthesia complications

## 2014-05-03 NOTE — Interval H&P Note (Signed)
History and Physical Interval Note:  05/03/2014 11:45 AM  Joy Cook  has presented today for surgery, with the diagnosis of Irregular Bleeding  The various methods of treatment have been discussed with the patient and family. After consideration of risks, benefits and other options for treatment, the patient has consented to  Procedure(s): DILATATION AND CURETTAGE /HYSTEROSCOPY (N/A) as a surgical intervention .  The patient's history has been reviewed, patient examined, no change in status, stable for surgery.  I have reviewed the patient's chart and labs.  Questions were answered to the patient's satisfaction.     Simona Huh, Haylo Fake

## 2014-05-03 NOTE — Anesthesia Preprocedure Evaluation (Addendum)
Anesthesia Evaluation  Patient identified by MRN, date of birth, ID band Patient awake    Reviewed: Allergy & Precautions, H&P , Patient's Chart, lab work & pertinent test results, reviewed documented beta blocker date and time   Airway Mallampati: III TM Distance: >3 FB Neck ROM: full    Dental no notable dental hx.    Pulmonary asthma (No inhaler for months) ,  breath sounds clear to auscultation  Pulmonary exam normal       Cardiovascular Rhythm:regular Rate:Normal     Neuro/Psych    GI/Hepatic GERD-  Medicated,  Endo/Other  Hypothyroidism   Renal/GU      Musculoskeletal   Abdominal   Peds  Hematology   Anesthesia Other Findings Large neck;   Reproductive/Obstetrics                          Anesthesia Physical Anesthesia Plan  ASA: II  Anesthesia Plan:    Post-op Pain Management:    Induction: Intravenous  Airway Management Planned: LMA  Additional Equipment:   Intra-op Plan:   Post-operative Plan:   Informed Consent: I have reviewed the patients History and Physical, chart, labs and discussed the procedure including the risks, benefits and alternatives for the proposed anesthesia with the patient or authorized representative who has indicated his/her understanding and acceptance.   Dental Advisory Given and Dental advisory given  Plan Discussed with: CRNA and Surgeon  Anesthesia Plan Comments: (Discussed GA with LMA, possible sore throat, potential need to switch to ETT, N/V, pulmonary aspiration. Questions answered. )        Anesthesia Quick Evaluation

## 2014-05-03 NOTE — Anesthesia Postprocedure Evaluation (Signed)
  Anesthesia Post-op Note  Patient: Joy Cook  Procedure(s) Performed: Procedure(s): DILATATION & CURETTAGE/HYSTEROSCOPY WITH RESECTOCOPE (N/A)  Patient Location: PACU  Anesthesia Type:General  Level of Consciousness: awake, alert  and oriented  Airway and Oxygen Therapy: Patient Spontanous Breathing  Post-op Pain: none  Post-op Assessment: Post-op Vital signs reviewed, Patient's Cardiovascular Status Stable, Respiratory Function Stable, Patent Airway, No signs of Nausea or vomiting, Adequate PO intake and Pain level controlled  Post-op Vital Signs: Reviewed and stable  Last Vitals:  Filed Vitals:   05/03/14 1415  BP: 140/74  Pulse: 67  Temp:   Resp: 24    Complications: No apparent anesthesia complications

## 2014-05-03 NOTE — Brief Op Note (Signed)
05/03/2014  1:01 PM  PATIENT:  Joy Cook  48 y.o. female  PRE-OPERATIVE DIAGNOSIS:  AUB  POST-OPERATIVE DIAGNOSIS:  Same, uterine fibroid  PROCEDURE:  Procedure(s): DILATATION & CURETTAGE/HYSTEROSCOPY WITH RESECTOCOPE (N/A)  SURGEON:  Surgeon(s) and Role:    * Thurnell Lose, MD - Primary  PHYSICIAN ASSISTANT: None   ASSISTANTS: Technician   ANESTHESIA:   general, local 2% Lidocaine  EBL:  Total I/O In: 1000 [I.V.:1000] Out: -  Fluid deficit 430  BLOOD ADMINISTERED:none  DRAINS: none   LOCAL MEDICATIONS USED:  LIDOCAINE   SPECIMEN:  Source of Specimen:  endometrial currettings, uterine fibroid  DISPOSITION OF SPECIMEN:  PATHOLOGY  COUNTS:  YES  TOURNIQUET:  * No tourniquets in log *  DICTATION: Dictation Number [id number]  PLAN OF CARE: Discharge to home after PACU  PATIENT DISPOSITION:  PACU - hemodynamically stable.   Delay start of Pharmacological VTE agent (>24hrs) due to surgical blood loss or risk of bleeding: not applicable

## 2014-05-03 NOTE — H&P (Signed)
Reason for Appointment   1. PreOp for 05/03/14   History of Present Illness   General:   48 y/o presents for preop for hysteroscopy/D&C for abnormal uterine  bleeding.  Pt has cyclic intermenstrual bleeding. Consistency of mucus mid cycle.  Attempted EMB at time of bleeding but could not get through the internal  os.  Ultrasound showed no fibroids, EMS 7.8 cm. Uterine length 9 cm.  Pt's TSH was previously uncontrolled. PCP adjusted medication.   Current Medications   Taking    Flovent HFA 110 MCG/ACT Aerosol 1 puff Twice a day    Protonix 40 MG Tablet Delayed Release 1 tablet Once a day    Zyrtec Allergy 10 MG Tablet 1 tablet as needed Once a day    Benadryl Allergy 25 MG Tablet 1 tablet as needed every 6 hrs    Tramadol HCl 50 MG Tablet 1 tablet as needed every 6 hrs    Synthroid 100 MCG Tablet 1 tablet once a day    Flonase 50 MCG/ACT Suspension 1 spray in each nostril Once a day    Not-Taking/PRN    Aleve 220 MG Tablet 1 tablet as needed every 12 hrs    Medication List reviewed and reconciled with the patient    Past Medical History   Hypothyroidism-s/p RAI for hyperthyroidism   Hypercholesterolemia, mild   Iron deficiency anemia   Diverticulosis (on colon 09/2009)   Recurrent bronchitis/asthma   vitamin D deficiency   Right breast mass s/p biopsy consistent wtih fibroadenoma in 08/2013, being  monitored at breast center   Blood transfusion postpartum   Surgical History   C-section x 3, BTL with 3rd    bilateral knee surgeries    left shoulder surgery    breast reduction    benign breast lump removal    liposuction and tummy tuck    D&C (less than 2 months) 1988   Family History   Father: alive, diagnosed with Prostate Ca   Mother: alive, kidney cancer, smoker, diagnosed with HTN   Paternal [place] Father: diagnosed with Prostate Ca   Maternal Grand Father: deceased, heart disease in late 59s   Maternal [place] Mother: deceased, heart disease in late 52s   Brother 1: alive  31 yrs, kidney surgery, most health conditions related to  drugs, diagnosed with DM, HTN, CHF   Sister 1: alive, diabetes   Sister 2: alive, liver problems, anorexic, alcoholism   Paternal aunt: cancer ?type   Daughter(s): chronic headache   No breast cancer or colon cancer.   No early heart disease.   Social History   General:   Tobacco use   cigarettes: Never smoked  Tobacco history last updated 04/24/2014  no Smoking.   no Alcohol.   no Caffeine.   no Recreational drug use.   Diet: yes.   Exercise: yes.   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   Marital Status: married, Married.   Children: 3, 1 son (84, Darnelle Maffucci) 2 daughters (27, [place], 23, Museum/gallery conservator).    Gyn History   Sexual activity currently sexually active, newlywed x 4 weeks.   Periods : every month.   LMP 02/12/14.   Birth control BTL.   Last pap smear date 03/2013, all negative.   Last mammogram date 08/18/13.   Abnormal pap smear treated with cryo.   STD none.    OB History   Number of pregnancies 3, 3 C/S, last pregnancy with twins and one twin  died in utero, no  EAB.   Pregnancy # 1 live birth, C-section delivery.   Pregnancy # 2 live birth, C-section.   Pregnancy # 3 live birth, C-section/BTL.    Allergies   hydrocodone: Fatigue, nausea: Side Effects   Codeine (for allergy): vomiting/fatique   Tandem: Nausea and fatigue: Side Effects   Tandem Plus: Nausea and fatigue: Side Effects   Hospitalization/Major Diagnostic Procedure   Not in last year 02/15/14   Review of Systems   Denies fever/chills, chest pain, SOB, headaches, numbness/tingling. No h/o  complication with anesthesia, bleeding disorders or blood clots.   Vital Signs   Wt 180, Wt change -1 lb, Ht 61, BMI 34.01, BP sitting 140/90.   Physical Examination   GENERAL:   Patient appears alert and oriented.   General Appearance: well-appearing, well-developed, no acute distress.   Speech: clear.   LUNGS:   Auscultation: no  wheezing/rhonchi/rales. CTA bilaterally.   HEART:   Heart sounds: normal. RRR. no murmur.   ABDOMEN:   General: soft nontender, nondistended, no masses.   FEMALE GENITOURINARY:   Pelvic Not examined.   EXTREMITIES:   General: No edema or calf tenderness.      Assessments   1. Pre-op exam - V72.84 (Primary)   2. Abnormal uterine bleeding (AUB) - 626.9   3. Hypothyroidism - 244.9   Treatment   1. Pre-op exam   Notes: R/B/A of procedure discussed with pt at length. All questions  answered. Consent obtained.      2. Hypothyroidism   LAB: TSH   Procedures   Venipuncture:   Venipuncture: Nils Pyle 04/24/2014 05:00:53 PM > , performed  in right arm.               Labs   Lab: TSH 2.38   TSH 2.38  0.34-4.50 - ulU/mL   Aarish Rockers B 04/26/2014 12:23:31 AM > Improved. Proceed with  surgery. Send to Dr. Orland Penman for review after pt notified. Allman,Michelle  04/26/2014 10:04:01 AM > Tried to call pt but vm not set up.  Allman,Michelle 04/27/2014 02:32:57 PM > Pt informed. OACZY,SAYTK  04/27/2014 03:12:27 PM > Noted.              Procedure Codes   16010 ECL TSH   93235 BLOOD COLLECTION ROUTINE VENIPUNCTURE     Follow Up   2 Weeks post op

## 2014-05-04 ENCOUNTER — Encounter (HOSPITAL_COMMUNITY): Payer: Self-pay | Admitting: Obstetrics and Gynecology

## 2014-05-04 NOTE — Op Note (Signed)
Joy Cook, Joy Cook               ACCOUNT NO.:  [id number]  MEDICAL RECORD NO.:  [id number]  LOCATION:  [facility]                          FACILITY:  [facility]  PHYSICIAN:  Jola Schmidt, MD   DATE OF BIRTH:  1966-07-03  DATE OF PROCEDURE:  05/03/2014 DATE OF DISCHARGE:  05/03/2014                              OPERATIVE REPORT   PREOPERATIVE DIAGNOSIS:  Abnormal uterine bleeding.  POSTOPERATIVE DIAGNOSES:  Abnormal uterine bleeding and uterine fibroid.  PROCEDURE:  Hysteroscopy and dilation and curettage with resectoscope.  SURGEON:  Jola Schmidt, MD  ASSISTANT:  Technician.  ANESTHESIA:  General with 2% lidocaine.  ESTIMATED BLOOD LOSS:  Minimal.  IV FLUIDS:  7000 mL.  FLUID DEFICIT:  Glycine and saline is 430 mL.  BLOOD ADMINISTERED:  None.  DRAINS:  None.  LOCAL:  Lidocaine.  SPECIMEN:  Endometrial curettings and uterine fibroid to Pathology.  DISPOSITION:  The patient disposition to PACU hemodynamically stable.  COMPLICATIONS:  None.  FINDINGS:  Uterus sounded to 8 cm.  A small uterine fibroid noted near the right tubal ostia.  Endometrium appeared normal.  Normal endometrial cavity and cervix consistent with previous lacerations.  PROCEDURE IN DETAIL:  The patient was identified in the holding area. She was then taken to the operating room, where she was placed in the dorsal supine position.  She underwent general anesthesia without complication.  She was then placed in the dorsal lithotomy position and prepped and draped in a normal sterile fashion.  A time-out was taken prior to the procedure.  The patient received cefotetan preoperatively. SCDs were on and operating.  The Graves speculum was inserted into the vagina.  The patient had some prolapse and I suspected that the cervix had not been properly prepped. Betadine x3 was used to prep the cervical os.  The lidocaine was then injected at the anterior lip of the cervix and a single-tooth tenaculum was  applied and then at 3 and 9 o'clock, the 2% lidocaine was also injected and cervix was dilated up to 19.  Hysteroscope was then advanced and the findings above were noted.  I attempted to take a biopsy of the lesion initially and due to the feel of the lesion it felt like a fibroid.  At that time, it was decided to proceed with resection of the fibroid, which was not seen on ultrasound prior to this time.  Normal saline was hung in for the hysteroscope or hysteroscopy.  Glycine was then hung and the cavity was flushed for 2 minutes.  The resectoscope was then advanced and the myoma was then resected and removed until the base of the fibroid had been completely resected.  Once the resection was completed, the uterus was distended adequately. No active bleeding noted.  No evidence of perforation.  Throughout the procedure, the fluid management system had some technical difficulties initially during the early portion of the procedure, the deficit read 0. I was informed, after the procedure, that was because the plate on the bag that registers the fluid had been deactivated.  Also, the tubing from the bottom of the bag came off during the case and there was a large amount of the  fluid on the floor. There was 3000 of glycine hung and 900 of normal saline and the canister was 3000 which left a deficit of 900.  A good portion of that was on the floor. The machine was fixed and  after it was recalibrated, read 430 and that was the deficit that we use which I think is accurate.  The patient tolerated the procedure well.  All instrument and sponge counts were correct x3.  The single-tooth tenaculum was removed and there was no bleeding noted from the tenaculum site or from the cervix. The patient was awakened in the operating room and taken to the recovery room in stable condition.     Jola Schmidt, MD     EBV/MEDQ  D:  05/03/2014  T:  05/03/2014  Job:  [id number]

## 2014-09-12 ENCOUNTER — Other Ambulatory Visit: Payer: Self-pay

## 2014-09-12 DIAGNOSIS — Z1231 Encounter for screening mammogram for malignant neoplasm of breast: Secondary | ICD-10-CM

## 2014-12-18 ENCOUNTER — Ambulatory Visit (INDEPENDENT_AMBULATORY_CARE_PROVIDER_SITE_OTHER): Payer: Federal, State, Local not specified - PPO | Admitting: Sports Medicine

## 2014-12-18 ENCOUNTER — Encounter: Payer: Self-pay | Admitting: Sports Medicine

## 2014-12-18 VITALS — BP 154/90 | Ht 62.0 in | Wt 167.0 lb

## 2014-12-18 DIAGNOSIS — M542 Cervicalgia: Secondary | ICD-10-CM

## 2014-12-18 NOTE — Progress Notes (Signed)
   Subjective:    Patient ID: Joy Cook, female    DOB: [birth year], 49 y.o.   MRN: [id number]  HPI chief complaint: Neck pain  49 year old female comes in today complaining of 1 year of worsening right-sided neck pain. She states that she was having symptoms prior to this but they were tolerable. About a year ago she began to experience pain along with numbness and tingling down her right arm into her fourth and fifth digits. When her symptoms intensified she sought treatment at [facility]. She was initially seen by Dr.Brooks who ordered an MRI of her cervical spine. The MRI was done in March of this year and showed an annular disc tear at C5-C6. She also had some mild multilevel degenerative changes but no discrete pathology to explain her symptoms. She was then referred to Dr.Ramos for cervical epidural steroid injections and these did seem to temporarily help her. An ergonomic evaluation of her workstation was also ordered that this was not done as her employer wanted to file this under workers comp if they did this evaluation. She subsequently underwent an EMG/nerve conduction study but those results are not currently available to me. 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 She has tried tramadol as well as Tylenol arthritis. She denies any prior surgeries to her neck in the past. She is here today with her husband.  Past medical history reviewed Medications reviewed Allergies reviewed    Review of Systems    as above Objective:   Physical Exam Well-developed, well-nourished.  Cervical spine: Limited cervical rotation in all planes due to pain. Diffuse tenderness to palpation. Spurling's was not performed due to pain.  Neurological exam: She has global weakness of the right upper extremity but I do not appreciate any  focal weakness. No obvious atrophy. Reflexes are 1/4 at the biceps, triceps, and brachial radialis tendons. Good radial ulnar pulses. Decreased grip strength secondary to pain.       Assessment & Plan:  Chronic neck pain and right arm radiculopathy  I personally reviewed the MRI and I do not see any pathology to explain her symptoms. I've asked her husband to fax me a copy of the EMG/nerve conduction study. I have also requested the records from [facility]. I will call the patient later this week once I have reviewed all of this information. She has an appointment for a second opinion with Dr. Arnoldo Morale later this month and I have encouraged her to keep that appointment. I've also explained that if no surgery is needed but her pain persists then she may want to consider pain management.

## 2014-12-22 ENCOUNTER — Telehealth: Payer: Self-pay | Admitting: Sports Medicine

## 2014-12-22 NOTE — Telephone Encounter (Signed)
I spoke with the patient on the phone today after reviewing the medical records from [facility]. I was also able to get a copy of her EMG/nerve conduction study. It looks as though she has had a thorough workup. Unfortunately, I do not believe I have anything further to add to her workup or treatment. She has an appointment with Dr. Vertell Limber at the end of the month for second opinion and I have encouraged her to keep that appointment. If Dr. Vertell Limber agrees with Dr. Rolena Infante that there is no surgical pathology then she may want to consider pain management.

## 2014-12-25 ENCOUNTER — Encounter: Payer: Self-pay | Admitting: Sports Medicine

## 2015-01-24 ENCOUNTER — Ambulatory Visit: Payer: Federal, State, Local not specified - PPO | Admitting: Sports Medicine

## 2015-02-20 ENCOUNTER — Ambulatory Visit
Admission: RE | Admit: 2015-02-20 | Discharge: 2015-02-20 | Disposition: A | Discharge status: Home / Self Care | Payer: Federal, State, Local not specified - PPO | Source: Ambulatory Visit

## 2015-02-20 ENCOUNTER — Other Ambulatory Visit: Payer: Self-pay

## 2015-02-20 DIAGNOSIS — Z1231 Encounter for screening mammogram for malignant neoplasm of breast: Secondary | ICD-10-CM

## 2015-04-14 DIAGNOSIS — M5412 Radiculopathy, cervical region: Secondary | ICD-10-CM | POA: Insufficient documentation

## 2015-04-14 DIAGNOSIS — M542 Cervicalgia: Secondary | ICD-10-CM | POA: Insufficient documentation

## 2015-04-30 ENCOUNTER — Encounter: Payer: Federal, State, Local not specified - PPO | Attending: Obstetrics and Gynecology | Admitting: *Deleted

## 2015-04-30 ENCOUNTER — Encounter: Payer: Self-pay | Admitting: *Deleted

## 2015-04-30 DIAGNOSIS — E639 Nutritional deficiency, unspecified: Secondary | ICD-10-CM | POA: Diagnosis not present

## 2015-04-30 DIAGNOSIS — Z713 Dietary counseling and surveillance: Secondary | ICD-10-CM | POA: Diagnosis not present

## 2015-04-30 DIAGNOSIS — Z6830 Body mass index (BMI) 30.0-30.9, adult: Secondary | ICD-10-CM | POA: Insufficient documentation

## 2015-04-30 DIAGNOSIS — E669 Obesity, unspecified: Secondary | ICD-10-CM

## 2015-04-30 NOTE — Progress Notes (Signed)
Medical Nutrition Therapy:  Appt start time: 0900 end time:  1000.   Assessment:  Primary concerns today: Joy Cook is here with her husband for nutrition counseling pertaining to desire to lose weight.  She states she encournted weight gain when she started synthroid a couple years ago.  She also complaisn fo GI upset with consuming "leafy food."  She repotst she visited GI specialist and nothing was wrong.  She reports having plastic surgery on her stomach, but still feels really bloated all the time. This has been going on since she was a child.    Heaviest weight outside pregnancy: 175 lb, currently Lowest weight as adult: 107 lb Normal weight: 130 lb Would like to weigh: 130 lb  Mom got heavy in her late 60s and then lost weight.  Sisters are smaller, but they're half sisters.Thinks she looks like her mom.  Has attempted weight loss through exercise and "healthy eating" (fresh vegetables.  Was vegetarian for ~20 years).  Stopped being vegetarian ~2 years ago.   She does the grocery shopping and the cooking for the household.  She typically bakes and grills and sometimes fries.  Eats out maybe 2 times/week.  Goes to [employer], [employer], [employer], [employer].  When at home she eats in the living room while watching tv.  Husband disagrees with this.  She is a fast eater.  She says her appetite gets spoiled and she won't want to eat.  She worries about food safety and contamination; she also worries about what people look like when eating, and animal treatment.  She worries a lot about various things that actually prevent her from eating.  She also mentioned in the routine depression screening that she feels "down" sometimes.  This provider recommended counseling and Denys agreed.  Preferred Learning Style:   No preference indicated   Learning Readiness:   Ready  MEDICATIONS: see list   DIETARY INTAKE: Usual eating pattern includes 2 meals and 3 snacks per  day. Avoided foods include meat on the bone.    24-hr recall:  B ( AM): sausage biscuit at [employer].  Smoothie (berries, pineapple, banana, spinach, carrots, almond milk, yogurt),  Snk ( AM): none yesterday; 1.5 slices bacon, peeled apple L ( PM): none yesterday. Normally chicken salad with balsamic vinegarette, 1000 island dressing around 3 pm Snk ( PM):  ritz crackers , 4 pork rinds D ( PM): [employer] bread, but only ate outside crust.  Fried chicken salad, baked potato with butter Snk ( PM): none; normally appled Beverages: coffee, water  Usual physical activity: none; has slipped disc in neck making exercise difficult.  Is unable to work due to pain  Estimated energy needs: 1600-1800 calories    Nutritional Diagnosis:  NB-1.5 Disordered eating pattern As related to meal skiping and food aversion.  As evidenced by dietary recall.    Intervention:  Nutrition counseling provided.  Discussed metabolic effects of meal skipping and discouraged this practice.  Recommended 3 meals/day based on My[employer] guidelines.  Answered questions about saturated fat and cholesterol and fiber.  Recommended more mindful eating: eating at the table without distractions; eating more slowly and honoring internal hunger and fullness cues.  Recommended several local therapists who work with eating behaviors.  Discussed possible exercise ideas: stationary bike or treadmill.  She says she could do 10-20 minutes/day.  Discussed HAES principles and encouraged her to honor her body.  Teaching Method Utilized:  Visual Auditory   Barriers to learning/adherence to lifestyle change:  none  Demonstrated degree of understanding via:  Teach Back   Monitoring/Evaluation:  Dietary intake, exercise, labs, and body weight prn.

## 2015-05-17 ENCOUNTER — Other Ambulatory Visit: Payer: Self-pay | Admitting: Family Medicine

## 2015-05-17 DIAGNOSIS — E049 Nontoxic goiter, unspecified: Secondary | ICD-10-CM

## 2015-05-24 ENCOUNTER — Other Ambulatory Visit: Payer: Federal, State, Local not specified - PPO

## 2015-05-31 ENCOUNTER — Ambulatory Visit
Admission: RE | Admit: 2015-05-31 | Discharge: 2015-05-31 | Disposition: A | Discharge status: Home / Self Care | Payer: Federal, State, Local not specified - PPO | Source: Ambulatory Visit | Attending: Family Medicine | Admitting: Family Medicine

## 2015-05-31 DIAGNOSIS — E049 Nontoxic goiter, unspecified: Secondary | ICD-10-CM

## 2015-06-08 ENCOUNTER — Encounter (HOSPITAL_COMMUNITY): Payer: Self-pay | Admitting: Emergency Medicine

## 2015-11-14 ENCOUNTER — Other Ambulatory Visit: Payer: Self-pay | Admitting: Family Medicine

## 2015-11-14 DIAGNOSIS — R1011 Right upper quadrant pain: Secondary | ICD-10-CM

## 2015-11-22 ENCOUNTER — Ambulatory Visit
Admission: RE | Admit: 2015-11-22 | Discharge: 2015-11-22 | Disposition: A | Discharge status: Home / Self Care | Payer: Federal, State, Local not specified - PPO | Source: Ambulatory Visit | Attending: Family Medicine | Admitting: Family Medicine

## 2015-11-22 DIAGNOSIS — R1011 Right upper quadrant pain: Secondary | ICD-10-CM

## 2015-11-29 ENCOUNTER — Other Ambulatory Visit (HOSPITAL_COMMUNITY): Payer: Self-pay | Admitting: Family Medicine

## 2015-11-29 DIAGNOSIS — R1011 Right upper quadrant pain: Secondary | ICD-10-CM

## 2015-12-03 ENCOUNTER — Ambulatory Visit (HOSPITAL_COMMUNITY)
Admission: RE | Admit: 2015-12-03 | Discharge: 2015-12-03 | Disposition: A | Discharge status: Home / Self Care | Payer: Federal, State, Local not specified - PPO | Source: Ambulatory Visit | Attending: Family Medicine | Admitting: Family Medicine

## 2015-12-03 DIAGNOSIS — R1011 Right upper quadrant pain: Secondary | ICD-10-CM | POA: Diagnosis not present

## 2015-12-03 MED ORDER — TECHNETIUM TC 99M MEBROFENIN IV KIT
5.0000 | PACK | Freq: Once | INTRAVENOUS | Status: AC | PRN
  Administered 2015-12-03: 5 via INTRAVENOUS

## 2015-12-05 ENCOUNTER — Encounter (HOSPITAL_COMMUNITY): Payer: Federal, State, Local not specified - PPO

## 2015-12-06 ENCOUNTER — Other Ambulatory Visit: Payer: Self-pay

## 2015-12-06 DIAGNOSIS — Z1231 Encounter for screening mammogram for malignant neoplasm of breast: Secondary | ICD-10-CM

## 2015-12-17 DIAGNOSIS — M5481 Occipital neuralgia: Secondary | ICD-10-CM | POA: Insufficient documentation

## 2016-02-22 ENCOUNTER — Ambulatory Visit
Admission: RE | Admit: 2016-02-22 | Discharge: 2016-02-22 | Disposition: A | Discharge status: Home / Self Care | Payer: Federal, State, Local not specified - PPO | Source: Ambulatory Visit

## 2016-02-22 ENCOUNTER — Other Ambulatory Visit: Payer: Self-pay | Admitting: Family Medicine

## 2016-02-22 DIAGNOSIS — Z1231 Encounter for screening mammogram for malignant neoplasm of breast: Secondary | ICD-10-CM

## 2016-05-07 ENCOUNTER — Other Ambulatory Visit: Payer: Self-pay | Admitting: Obstetrics and Gynecology

## 2016-05-07 ENCOUNTER — Other Ambulatory Visit (HOSPITAL_COMMUNITY)
Admission: RE | Admit: 2016-05-07 | Discharge: 2016-05-07 | Disposition: A | Discharge status: Home / Self Care | Payer: Federal, State, Local not specified - PPO | Source: Ambulatory Visit | Attending: Obstetrics and Gynecology | Admitting: Obstetrics and Gynecology

## 2016-05-07 DIAGNOSIS — Z01419 Encounter for gynecological examination (general) (routine) without abnormal findings: Secondary | ICD-10-CM | POA: Diagnosis present

## 2016-05-07 DIAGNOSIS — Z1151 Encounter for screening for human papillomavirus (HPV): Secondary | ICD-10-CM | POA: Insufficient documentation

## 2016-05-09 LAB — CYTOLOGY - PAP

## 2016-07-11 ENCOUNTER — Other Ambulatory Visit: Payer: Self-pay | Admitting: General Surgery

## 2016-07-11 DIAGNOSIS — K603 Anal fistula: Secondary | ICD-10-CM

## 2016-07-22 ENCOUNTER — Ambulatory Visit
Admission: RE | Admit: 2016-07-22 | Discharge: 2016-07-22 | Disposition: A | Discharge status: Home / Self Care | Payer: Federal, State, Local not specified - PPO | Source: Ambulatory Visit | Attending: General Surgery | Admitting: General Surgery

## 2016-07-22 DIAGNOSIS — K603 Anal fistula: Secondary | ICD-10-CM

## 2016-07-22 MED ORDER — GADOBENATE DIMEGLUMINE 529 MG/ML IV SOLN
15.0000 mL | Freq: Once | INTRAVENOUS | Status: AC | PRN
  Administered 2016-07-22: 15 mL via INTRAVENOUS

## 2016-08-08 ENCOUNTER — Other Ambulatory Visit (HOSPITAL_COMMUNITY): Payer: Self-pay | Admitting: Gastroenterology

## 2016-08-08 DIAGNOSIS — R140 Abdominal distension (gaseous): Secondary | ICD-10-CM

## 2016-08-11 DIAGNOSIS — Z8719 Personal history of other diseases of the digestive system: Secondary | ICD-10-CM

## 2016-08-11 HISTORY — DX: Personal history of other diseases of the digestive system: Z87.19

## 2016-08-15 ENCOUNTER — Ambulatory Visit (HOSPITAL_COMMUNITY)
Admission: RE | Admit: 2016-08-15 | Discharge: 2016-08-15 | Disposition: A | Discharge status: Home / Self Care | Payer: Federal, State, Local not specified - PPO | Source: Ambulatory Visit | Attending: Gastroenterology | Admitting: Gastroenterology

## 2016-08-15 DIAGNOSIS — R140 Abdominal distension (gaseous): Secondary | ICD-10-CM | POA: Diagnosis not present

## 2016-08-15 MED ORDER — TECHNETIUM TC 99M SULFUR COLLOID
1.9000 | Freq: Once | INTRAVENOUS | Status: AC | PRN
  Administered 2016-08-15: 1.9 via INTRAVENOUS

## 2016-08-18 ENCOUNTER — Other Ambulatory Visit: Payer: Self-pay | Admitting: Gastroenterology

## 2016-08-18 DIAGNOSIS — R140 Abdominal distension (gaseous): Secondary | ICD-10-CM

## 2016-08-20 ENCOUNTER — Ambulatory Visit
Admission: RE | Admit: 2016-08-20 | Discharge: 2016-08-20 | Disposition: A | Discharge status: Home / Self Care | Payer: Federal, State, Local not specified - PPO | Source: Ambulatory Visit | Attending: Gastroenterology | Admitting: Gastroenterology

## 2016-08-20 ENCOUNTER — Other Ambulatory Visit: Payer: Self-pay | Admitting: Infectious Disease

## 2016-08-20 DIAGNOSIS — R140 Abdominal distension (gaseous): Secondary | ICD-10-CM

## 2016-09-02 ENCOUNTER — Other Ambulatory Visit: Payer: Self-pay | Admitting: Gastroenterology

## 2016-09-02 DIAGNOSIS — R140 Abdominal distension (gaseous): Secondary | ICD-10-CM

## 2016-09-02 DIAGNOSIS — R1084 Generalized abdominal pain: Secondary | ICD-10-CM

## 2016-09-05 ENCOUNTER — Ambulatory Visit
Admission: RE | Admit: 2016-09-05 | Discharge: 2016-09-05 | Disposition: A | Discharge status: Home / Self Care | Payer: Federal, State, Local not specified - PPO | Source: Ambulatory Visit | Attending: Gastroenterology | Admitting: Gastroenterology

## 2016-09-05 DIAGNOSIS — R1084 Generalized abdominal pain: Secondary | ICD-10-CM

## 2016-09-05 DIAGNOSIS — R140 Abdominal distension (gaseous): Secondary | ICD-10-CM

## 2016-09-05 MED ORDER — IOPAMIDOL (ISOVUE-300) INJECTION 61%: 100.0000 mL | Freq: Once | INTRAVENOUS | Status: DC | PRN

## 2016-09-08 DIAGNOSIS — R200 Anesthesia of skin: Secondary | ICD-10-CM | POA: Insufficient documentation

## 2016-09-08 DIAGNOSIS — R52 Pain, unspecified: Secondary | ICD-10-CM | POA: Insufficient documentation

## 2016-09-09 ENCOUNTER — Other Ambulatory Visit: Payer: Self-pay | Admitting: Obstetrics and Gynecology

## 2016-09-09 DIAGNOSIS — N63 Unspecified lump in unspecified breast: Secondary | ICD-10-CM

## 2016-09-10 ENCOUNTER — Emergency Department (HOSPITAL_COMMUNITY): Payer: Federal, State, Local not specified - PPO

## 2016-09-10 ENCOUNTER — Encounter (HOSPITAL_COMMUNITY): Payer: Self-pay | Admitting: Family Medicine

## 2016-09-10 ENCOUNTER — Emergency Department (HOSPITAL_COMMUNITY)
Admission: EM | Admit: 2016-09-10 | Discharge: 2016-09-10 | Disposition: A | Discharge status: Home / Self Care | Payer: Federal, State, Local not specified - PPO | Attending: Emergency Medicine | Admitting: Emergency Medicine

## 2016-09-10 DIAGNOSIS — Z79899 Other long term (current) drug therapy: Secondary | ICD-10-CM | POA: Insufficient documentation

## 2016-09-10 DIAGNOSIS — K5732 Diverticulitis of large intestine without perforation or abscess without bleeding: Secondary | ICD-10-CM | POA: Diagnosis not present

## 2016-09-10 DIAGNOSIS — J45909 Unspecified asthma, uncomplicated: Secondary | ICD-10-CM | POA: Insufficient documentation

## 2016-09-10 DIAGNOSIS — R072 Precordial pain: Secondary | ICD-10-CM | POA: Diagnosis not present

## 2016-09-10 DIAGNOSIS — E039 Hypothyroidism, unspecified: Secondary | ICD-10-CM | POA: Insufficient documentation

## 2016-09-10 DIAGNOSIS — R109 Unspecified abdominal pain: Secondary | ICD-10-CM | POA: Diagnosis present

## 2016-09-10 DIAGNOSIS — R079 Chest pain, unspecified: Secondary | ICD-10-CM

## 2016-09-10 LAB — HEPATIC FUNCTION PANEL
ALT: 14 U/L (ref 14–54)
AST: 17 U/L (ref 15–41)
Albumin: 4.1 g/dL (ref 3.5–5.0)
Alkaline Phosphatase: 65 U/L (ref 38–126)
BILIRUBIN DIRECT: 0.1 mg/dL (ref 0.1–0.5)
BILIRUBIN INDIRECT: 0.1 mg/dL — AB (ref 0.3–0.9)
Total Bilirubin: 0.2 mg/dL — ABNORMAL LOW (ref 0.3–1.2)
Total Protein: 7.7 g/dL (ref 6.5–8.1)

## 2016-09-10 LAB — BASIC METABOLIC PANEL
Anion gap: 11 (ref 5–15)
BUN: 7 mg/dL (ref 6–20)
CALCIUM: 9.2 mg/dL (ref 8.9–10.3)
CO2: 24 mmol/L (ref 22–32)
Chloride: 99 mmol/L — ABNORMAL LOW (ref 101–111)
Creatinine, Ser: 0.74 mg/dL (ref 0.44–1.00)
GFR calc Af Amer: >60 mL/min (ref >60)
GLUCOSE: 128 mg/dL — AB (ref 65–99)
Potassium: 3 mmol/L — ABNORMAL LOW (ref 3.5–5.1)
Sodium: 134 mmol/L — ABNORMAL LOW (ref 135–145)

## 2016-09-10 LAB — URINALYSIS, ROUTINE W REFLEX MICROSCOPIC
Bilirubin Urine: NEGATIVE
Glucose, UA: NEGATIVE mg/dL
Hgb urine dipstick: NEGATIVE
Ketones, ur: NEGATIVE mg/dL
LEUKOCYTES UA: NEGATIVE
NITRITE: NEGATIVE
PH: 7 (ref 5.0–8.0)
Protein, ur: NEGATIVE mg/dL
Specific Gravity, Urine: 1.006 (ref 1.005–1.030)

## 2016-09-10 LAB — LIPASE, BLOOD: Lipase: 14 U/L (ref 11–51)

## 2016-09-10 LAB — I-STAT TROPONIN, ED
TROPONIN I, POC: 0 ng/mL (ref 0.00–0.08)
Troponin i, poc: 0.03 ng/mL (ref 0.00–0.08)

## 2016-09-10 LAB — CBC
HCT: 34.8 % — ABNORMAL LOW (ref 36.0–46.0)
Hemoglobin: 11.6 g/dL — ABNORMAL LOW (ref 12.0–15.0)
MCH: 26.9 pg (ref 26.0–34.0)
MCHC: 33.3 g/dL (ref 30.0–36.0)
MCV: 80.7 fL (ref 78.0–100.0)
PLATELETS: 256 10*3/uL (ref 150–400)
RBC: 4.31 MIL/uL (ref 3.87–5.11)
RDW: 13 % (ref 11.5–15.5)
WBC: 16.3 10*3/uL — ABNORMAL HIGH (ref 4.0–10.5)

## 2016-09-10 MED ORDER — CIPROFLOXACIN HCL 500 MG PO TABS: 500.0000 mg | ORAL_TABLET | Freq: Two times a day (BID) | ORAL | 0 refills | Status: DC

## 2016-09-10 MED ORDER — METRONIDAZOLE 500 MG PO TABS: 500.0000 mg | ORAL_TABLET | Freq: Two times a day (BID) | ORAL | 0 refills | Status: DC

## 2016-09-10 MED ORDER — SODIUM CHLORIDE 0.9 % IV BOLUS (SEPSIS)
1000.0000 mL | Freq: Once | INTRAVENOUS | Status: AC
  Administered 2016-09-10: 1000 mL via INTRAVENOUS

## 2016-09-10 MED ORDER — ONDANSETRON 4 MG PO TBDP: 4.0000 mg | ORAL_TABLET | Freq: Three times a day (TID) | ORAL | 0 refills | Status: DC | PRN

## 2016-09-10 MED ORDER — MORPHINE SULFATE (PF) 4 MG/ML IV SOLN
4.0000 mg | Freq: Once | INTRAVENOUS | Status: AC
Start: 2016-09-10 — End: 2016-09-10
  Administered 2016-09-10: 4 mg via INTRAVENOUS
  Filled 2016-09-10: qty 1

## 2016-09-10 MED ORDER — OXYCODONE-ACETAMINOPHEN 5-325 MG PO TABS
1.0000 | ORAL_TABLET | Freq: Once | ORAL | Status: AC
  Administered 2016-09-10: 1 via ORAL
  Filled 2016-09-10: qty 1

## 2016-09-10 MED ORDER — ONDANSETRON HCL 4 MG/2ML IJ SOLN
4.0000 mg | Freq: Once | INTRAMUSCULAR | Status: AC
  Administered 2016-09-10: 4 mg via INTRAVENOUS
  Filled 2016-09-10: qty 2

## 2016-09-10 MED ORDER — METRONIDAZOLE 500 MG PO TABS
500.0000 mg | ORAL_TABLET | Freq: Once | ORAL | Status: AC
  Administered 2016-09-10: 500 mg via ORAL
  Filled 2016-09-10: qty 1

## 2016-09-10 MED ORDER — OXYCODONE-ACETAMINOPHEN 5-325 MG PO TABS: 1.0000 | ORAL_TABLET | Freq: Four times a day (QID) | ORAL | 0 refills | Status: DC | PRN

## 2016-09-10 MED ORDER — CIPROFLOXACIN IN D5W 400 MG/200ML IV SOLN
400.0000 mg | Freq: Once | INTRAVENOUS | Status: AC
  Administered 2016-09-10: 400 mg via INTRAVENOUS
  Filled 2016-09-10: qty 200

## 2016-09-10 NOTE — Discharge Instructions (Signed)
You were seen today for abdominal pain and chest pain. You have diverticulitis. You'll be discharged with antibiotics and pain medication. If you develop worsening pain, fever or any new or worsening symptoms you need to be reevaluated. Regarding her chest pain, workup in the ED is reassuring. Given your age and history, you do need to follow-up with cardiology for formal stress testing. If you have any new or worsening symptoms you should be reevaluated.

## 2016-09-10 NOTE — ED Triage Notes (Signed)
Patient reports she developed left abd pain yesterday morning. Then about 2.5 hours ago she developed mid sternal chest pain that radiates to her right arm. PT has took TYLENOL 1000mg  about 1.5hrs ago with no relief. Pt reports she was lying down when she developed the chest pain.

## 2016-09-10 NOTE — ED Notes (Signed)
Pt states that she started having left sharp and shooting flank pain that began yesterday morning, and became worse, ad she had a low grade fever of 100.1. Pt denies hematuria, or pain or buring with urination

## 2016-09-10 NOTE — ED Provider Notes (Signed)
[facility] DEPT Provider Note   CSN: [id number] Arrival date & time: 09/10/16  0129 By signing my name below, I, Dyke Brackett, attest that this documentation has been prepared under the direction and in the presence of Merryl Hacker, MD . Electronically Signed: Dyke Brackett, Scribe. 09/10/2016. 3:37 AM.   History   Chief Complaint Chief Complaint  Patient presents with  . Abdominal Pain  . Chest Pain    HPI Joy Cook is a 51 y.o. female with a PMHx of asthma, GERD, and hypothyroid disease who presents to the Emergency Department complaining of intermittent, 10/10 left sided abdominal pain that radiates to her LLQ, onset yesterday. Pt states she has never had this pain before. She also reports mid-sternal chest pain that radiates to her right arm. Pt has taken tylenol with no significant relief. Pt reports a history of HTN, but denies any personal or family cardiac history. She denies any nausea, vomiting, diarrhea, dysuria, hematuria, fever, rash, or any other symptoms. Pt has no other complaints at this time.   The history is provided by the patient. No language interpreter was used.   Past Medical History:  Diagnosis Date  . Asthma   . GERD (gastroesophageal reflux disease)   . Hypothyroidism   . Thyroid disease     There are no active problems to display for this patient.  Past Surgical History:  Procedure Laterality Date  . BREAST REDUCTION SURGERY    . BREAST SURGERY    . BREAST SURGERY  LUMP REMOVED   1988  . CESAREAN SECTION    . CESAREAN SECTION     X3  . COSMETIC SURGERY    . DILATATION & CURRETTAGE/HYSTEROSCOPY WITH RESECTOCOPE N/A 05/03/2014   Procedure: [place];  Surgeon: Thurnell Lose, MD;  Location: [facility];  Service: Gynecology;  Laterality: N/A;  . HERNIA REPAIR    . KNEE ARTHROSCOPY     LEFT AND RIGHT  . SHOULDER ARTHROSCOPY    . SHOULDER SURGERY Left   . tummy tuck  2010    OB History    Gravida Para Term Preterm AB Living   0 0 0 0 0     SAB TAB Ectopic Multiple Live Births   0 0 0          Home Medications    Prior to Admission medications   Medication Sig Start Date End Date Taking? Authorizing Provider  amLODipine (NORVASC) 5 MG tablet Take 1 tablet by mouth daily. 02/19/15  Yes Historical Provider, MD  cetirizine (ZYRTEC ALLERGY) 10 MG tablet Take 10 mg by mouth daily as needed for allergies.   Yes Historical Provider, MD  diphenhydrAMINE (BENADRYL) 25 mg capsule Take 25-50 mg by mouth daily as needed for allergies.   Yes Historical Provider, MD  ferrous fumarate (HEMOCYTE - 106 MG FE) 325 (106 FE) MG TABS tablet Take 1 tablet by mouth.   Yes Historical Provider, MD  fluticasone (FLOVENT HFA) 110 MCG/ACT inhaler Inhale into the lungs 2 (two) times daily.   Yes Historical Provider, MD  gabapentin (NEURONTIN) 100 MG capsule Take 100 mg by mouth 3 (three) times daily.   Yes Historical Provider, MD  hydrochlorothiazide (HYDRODIURIL) 25 MG tablet Take 1 tablet by mouth daily. 08/18/16  Yes Historical Provider, MD  levothyroxine (SYNTHROID, LEVOTHROID) 100 MCG tablet Take 100 mcg by mouth daily before breakfast.   Yes Historical Provider, MD  medroxyPROGESTERone (PROVERA) 10 MG tablet Take 1 tablet by mouth daily. 09/06/16  Yes Historical Provider, MD  meloxicam (MOBIC) 15 MG tablet Take 1 tablet by mouth daily. For 10 days monthly 05/06/16 05/06/17 Yes Historical Provider, MD  norethindrone-ethinyl estradiol-iron (ESTROSTEP FE,TILIA FE,TRI-LEGEST FE) 1-20/1-30/1-35 MG-MCG tablet Take 1 tablet by mouth daily.   Yes Historical Provider, MD  OVER THE COUNTER MEDICATION Apply 1 application topically 3 (three) times daily. Product called atomic bomb creamogestic that contains methyl salicylate and capsaicin.  Pt will stop on using 04-21-14.   Yes Historical Provider, MD  pantoprazole (PROTONIX) 20 MG tablet Take 1 tablet (20 mg total) by mouth daily. 07/29/13  Yes Evelina Bucy, MD    traMADol (ULTRAM) 50 MG tablet Take 0.5 tablets (25 mg total) by mouth every 6 (six) hours as needed for moderate pain. 05/03/14  Yes Thurnell Lose, MD  ciprofloxacin (CIPRO) 500 MG tablet Take 1 tablet (500 mg total) by mouth every 12 (twelve) hours. 09/10/16   Merryl Hacker, MD  ibuprofen (ADVIL,MOTRIN) 600 MG tablet Take 1 tablet (600 mg total) by mouth every 6 (six) hours as needed. Patient not taking: Reported on 09/10/2016 05/03/14   Thurnell Lose, MD  metroNIDAZOLE (FLAGYL) 500 MG tablet Take 1 tablet (500 mg total) by mouth 2 (two) times daily. 09/10/16   Merryl Hacker, MD  ondansetron (ZOFRAN ODT) 4 MG disintegrating tablet Take 1 tablet (4 mg total) by mouth every 8 (eight) hours as needed for nausea or vomiting. 09/10/16   Merryl Hacker, MD  oxyCODONE-acetaminophen (PERCOCET/ROXICET) 5-325 MG tablet Take 1-2 tablets by mouth every 6 (six) hours as needed for severe pain. 09/10/16   Merryl Hacker, MD    Family History Family History  Problem Relation Age of Onset  . Hypertension Other     Social History Social History  Substance Use Topics  . Smoking status: Never Smoker  . Smokeless tobacco: Never Used  . Alcohol use No    Allergies   Hydrocodone; Shellfish allergy; Codeine; Hydrocodone; and Shellfish allergy   Review of Systems Review of Systems  Constitutional: Negative for fever.  Cardiovascular: Positive for chest pain.  Gastrointestinal: Positive for abdominal pain. Negative for diarrhea, nausea and vomiting.  Genitourinary: Negative for dysuria and hematuria.  Musculoskeletal: Positive for myalgias (right arm).  Skin: Negative for rash.  All other systems reviewed and are negative.  Physical Exam Updated Vital Signs BP 109/78 (BP Location: Right Arm)   Pulse 74   Temp 98.7 F (37.1 C) (Oral)   Resp 18   Ht 5\' 2"  (1.575 m)   Wt 171 lb (77.6 kg)   LMP 08/22/2016   SpO2 95%   BMI 31.28 kg/m   Physical Exam  Constitutional: She is oriented  to person, place, and time. She appears well-developed and well-nourished.  HENT:  Head: Normocephalic and atraumatic.  Cardiovascular: Normal rate, regular rhythm and normal heart sounds.   Pulmonary/Chest: Effort normal. No respiratory distress. She has no wheezes.  Abdominal: Soft. Bowel sounds are normal. She exhibits no mass. There is tenderness. There is no guarding.  Left lower quadrant tenderness palpation without rebound or guarding, left flank tenderness  Neurological: She is alert and oriented to person, place, and time.  Skin: Skin is warm and dry.  Psychiatric: She has a normal mood and affect.  Nursing note and vitals reviewed.   ED Treatments / Results  DIAGNOSTIC STUDIES:  Oxygen Saturation is 95% on RA, adequate by my interpretation.    COORDINATION OF CARE:  3:33 AM Will order CT renal study.  Discussed treatment plan which includes zofran and morphine with pt at bedside and pt agreed to plan.   Labs (all labs ordered are listed, but only abnormal results are displayed) Labs Reviewed  BASIC METABOLIC PANEL - Abnormal; Notable for the following:       Result Value   Sodium 134 (*)    Potassium 3.0 (*)    Chloride 99 (*)    Glucose, Bld 128 (*)    All other components within normal limits  CBC - Abnormal; Notable for the following:    WBC 16.3 (*)    Hemoglobin 11.6 (*)    HCT 34.8 (*)    All other components within normal limits  HEPATIC FUNCTION PANEL - Abnormal; Notable for the following:    Total Bilirubin 0.2 (*)    Indirect Bilirubin 0.1 (*)    All other components within normal limits  URINALYSIS, ROUTINE W REFLEX MICROSCOPIC - Abnormal; Notable for the following:    Color, Urine STRAW (*)    All other components within normal limits  LIPASE, BLOOD  I-STAT TROPOININ, ED  I-STAT TROPOININ, ED    EKG  EKG Interpretation  Date/Time:  Wednesday September 10 2016 01:43:37 EST Ventricular Rate:  100 PR Interval:    QRS Duration: 79 QT  Interval:  322 QTC Calculation: 416 R Axis:   -31 Text Interpretation:  Sinus tachycardia Left axis deviation Borderline T abnormalities, anterior leads Confirmed by Dina Rich  MD, COURTNEY ([id number]) on 09/10/2016 3:28:21 AM      Radiology Dg Chest 2 View  Result Date: 09/10/2016 CLINICAL DATA:  Acute onset chest pain this morning, LEFT flank pain. EXAM: CHEST  2 VIEW COMPARISON:  Chest radiograph August 29, 2015 FINDINGS: Cardiomediastinal silhouette is normal. No pleural effusions or focal consolidations. Trachea projects midline and there is no pneumothorax. Soft tissue planes and included osseous structures are non-suspicious. Scattered subcentimeter rounded densities in the abdomen most compatible with diverticulitis and retained contrast. Hair artifact projects in upper chest. IMPRESSION: No acute cardiopulmonary process. Electronically Signed   By: Elon Alas M.D.   On: 09/10/2016 02:23   Ct Renal Stone Study  Result Date: 09/10/2016 CLINICAL DATA:  Left flank pain radiating to the left lower quadrant. EXAM: CT ABDOMEN AND PELVIS WITHOUT CONTRAST TECHNIQUE: Multidetector CT imaging of the abdomen and pelvis was performed following the standard protocol without IV contrast. COMPARISON:  CT 5 days prior 09/05/2016 FINDINGS: Lower chest: Lower most lung bases are clear. Right breast nodule on prior exam not included in the field of view. Hepatobiliary: Unchanged cyst in the left lobe of the liver. Additional smaller subcentimeter lesions on prior CT are not visualized given lack of contrast. Gallbladder physiologically distended, no calcified stone. No biliary dilatation. Pancreas: No ductal dilatation or inflammation. Spleen: Normal in size without focal abnormality. Adrenals/Urinary Tract: No renal stones or hydronephrosis. Ureters decompressed without stones along the course. Urinary bladder is physiologically distended. Adrenal glands are normal. Stomach/Bowel: There is an inflamed  diverticulum with associated colonic thickening about the mid distal descending colon consistent with acute diverticulitis. This is new from prior exam. No perforation or abscess. Mild pericolonic edema and trace free fluid in the pericolic gutter. Additional noninflamed colonic diverticula are seen. No small bowel dilatation or inflammation. The appendix is normal. Vascular/Lymphatic: Abdominal aorta is normal in caliber. No adenopathy. Reproductive: Uterus and bilateral adnexa are unremarkable. Other: Postsurgical changes of the anterior abdominal wall. No free air. No intra-abdominal abscess. Musculoskeletal: There are no acute or  suspicious osseous abnormalities. IMPRESSION: Acute diverticulitis involving the mid distal descending colon. No perforation or abscess. Electronically Signed   By: Jeb Levering M.D.   On: 09/10/2016 04:54   Procedures Procedures (including critical care time)  Medications Ordered in ED Medications  sodium chloride 0.9 % bolus 1,000 mL (0 mLs Intravenous Stopped 09/10/16 0556)  morphine 4 MG/ML injection 4 mg (4 mg Intravenous Given 09/10/16 0423)  ondansetron (ZOFRAN) injection 4 mg (4 mg Intravenous Given 09/10/16 0423)  ciprofloxacin (CIPRO) IVPB 400 mg (400 mg Intravenous New Bag/Given 09/10/16 0516)  metroNIDAZOLE (FLAGYL) tablet 500 mg (500 mg Oral Given 09/10/16 0516)  oxyCODONE-acetaminophen (PERCOCET/ROXICET) 5-325 MG per tablet 1 tablet (1 tablet Oral Given 09/10/16 0556)     Initial Impression / Assessment and Plan / ED Course  I have reviewed the triage vital signs and the nursing notes.  Pertinent labs & imaging results that were available during my care of the patient were reviewed by me and considered in my medical decision making (see chart for details).    Patient presents with left lower quadrant chest pain. Also presents with abdominal pain. Nontoxic. She does have tenderness without signs of peritonitis. Lab workup notable for white count of 16.3.  Initial EKG and troponin reassuring. Unclear whether chest pain and abdominal pain or are related. Will repeat troponin. Regarding abdominal pain, diverticulitis, kidney stone are most likely considerations. Stone study ordered as patient is overweight and would likely show any diverticulitis. CT shows evidence of diverticulitis. Patient given Cipro and Flagyl. She reports marked improvement after pain and nausea medication. She is able to tolerate fluids. Will discharge him with pain and nausea medication as well as Cipro and Flagyl. Regarding her chest pain, repeat troponin reassuring. She is not having ongoing pain. Given risk factors, will have her follow-up with cardiologist.  After history, exam, and medical workup I feel the patient has been appropriately medically screened and is safe for discharge home. Pertinent diagnoses were discussed with the patient. Patient was given return precautions.   Final Clinical Impressions(s) / ED Diagnoses   Final diagnoses:  Diverticulitis of large intestine without perforation or abscess without bleeding  Chest pain, unspecified type    New Prescriptions New Prescriptions   CIPROFLOXACIN (CIPRO) 500 MG TABLET    Take 1 tablet (500 mg total) by mouth every 12 (twelve) hours.   METRONIDAZOLE (FLAGYL) 500 MG TABLET    Take 1 tablet (500 mg total) by mouth 2 (two) times daily.   ONDANSETRON (ZOFRAN ODT) 4 MG DISINTEGRATING TABLET    Take 1 tablet (4 mg total) by mouth every 8 (eight) hours as needed for nausea or vomiting.   OXYCODONE-ACETAMINOPHEN (PERCOCET/ROXICET) 5-325 MG TABLET    Take 1-2 tablets by mouth every 6 (six) hours as needed for severe pain.   I personally performed the services described in this documentation, which was scribed in my presence. The recorded information has been reviewed and is accurate.    Merryl Hacker, MD 09/10/16 [phone]

## 2016-09-15 ENCOUNTER — Ambulatory Visit
Admission: RE | Admit: 2016-09-15 | Discharge: 2016-09-15 | Disposition: A | Discharge status: Home / Self Care | Payer: Federal, State, Local not specified - PPO | Source: Ambulatory Visit | Attending: Obstetrics and Gynecology | Admitting: Obstetrics and Gynecology

## 2016-09-15 DIAGNOSIS — N63 Unspecified lump in unspecified breast: Secondary | ICD-10-CM

## 2016-09-23 ENCOUNTER — Emergency Department (HOSPITAL_COMMUNITY): Payer: Federal, State, Local not specified - PPO

## 2016-09-23 ENCOUNTER — Observation Stay (HOSPITAL_COMMUNITY)
Admission: EM | Admit: 2016-09-23 | Discharge: 2016-09-24 | Disposition: A | Discharge status: Home / Self Care | Payer: Federal, State, Local not specified - PPO | Attending: Family Medicine | Admitting: Family Medicine

## 2016-09-23 ENCOUNTER — Encounter (HOSPITAL_COMMUNITY): Payer: Self-pay

## 2016-09-23 ENCOUNTER — Other Ambulatory Visit: Payer: Self-pay | Admitting: Gastroenterology

## 2016-09-23 DIAGNOSIS — Z79899 Other long term (current) drug therapy: Secondary | ICD-10-CM | POA: Insufficient documentation

## 2016-09-23 DIAGNOSIS — E876 Hypokalemia: Secondary | ICD-10-CM | POA: Diagnosis present

## 2016-09-23 DIAGNOSIS — K219 Gastro-esophageal reflux disease without esophagitis: Secondary | ICD-10-CM | POA: Insufficient documentation

## 2016-09-23 DIAGNOSIS — Z7952 Long term (current) use of systemic steroids: Secondary | ICD-10-CM | POA: Diagnosis not present

## 2016-09-23 DIAGNOSIS — Z885 Allergy status to narcotic agent status: Secondary | ICD-10-CM | POA: Diagnosis not present

## 2016-09-23 DIAGNOSIS — K5792 Diverticulitis of intestine, part unspecified, without perforation or abscess without bleeding: Principal | ICD-10-CM | POA: Insufficient documentation

## 2016-09-23 DIAGNOSIS — J45909 Unspecified asthma, uncomplicated: Secondary | ICD-10-CM | POA: Diagnosis present

## 2016-09-23 DIAGNOSIS — Z793 Long term (current) use of hormonal contraceptives: Secondary | ICD-10-CM | POA: Insufficient documentation

## 2016-09-23 DIAGNOSIS — J4540 Moderate persistent asthma, uncomplicated: Secondary | ICD-10-CM | POA: Insufficient documentation

## 2016-09-23 DIAGNOSIS — Z791 Long term (current) use of non-steroidal anti-inflammatories (NSAID): Secondary | ICD-10-CM | POA: Diagnosis not present

## 2016-09-23 DIAGNOSIS — E039 Hypothyroidism, unspecified: Secondary | ICD-10-CM | POA: Diagnosis present

## 2016-09-23 DIAGNOSIS — Z91013 Allergy to seafood: Secondary | ICD-10-CM | POA: Diagnosis not present

## 2016-09-23 DIAGNOSIS — R1032 Left lower quadrant pain: Secondary | ICD-10-CM | POA: Diagnosis present

## 2016-09-23 DIAGNOSIS — K5732 Diverticulitis of large intestine without perforation or abscess without bleeding: Secondary | ICD-10-CM

## 2016-09-23 DIAGNOSIS — G894 Chronic pain syndrome: Secondary | ICD-10-CM | POA: Diagnosis present

## 2016-09-23 DIAGNOSIS — R109 Unspecified abdominal pain: Secondary | ICD-10-CM | POA: Diagnosis present

## 2016-09-23 LAB — URINALYSIS, ROUTINE W REFLEX MICROSCOPIC
Bacteria, UA: NONE SEEN
Bilirubin Urine: NEGATIVE
GLUCOSE, UA: NEGATIVE mg/dL
KETONES UR: NEGATIVE mg/dL
LEUKOCYTES UA: NEGATIVE
NITRITE: NEGATIVE
PH: 7 (ref 5.0–8.0)
Protein, ur: NEGATIVE mg/dL
Specific Gravity, Urine: 1.004 — ABNORMAL LOW (ref 1.005–1.030)

## 2016-09-23 LAB — CBC
HCT: 39.4 % (ref 36.0–46.0)
Hemoglobin: 13.3 g/dL (ref 12.0–15.0)
MCH: 27.1 pg (ref 26.0–34.0)
MCHC: 33.8 g/dL (ref 30.0–36.0)
MCV: 80.2 fL (ref 78.0–100.0)
PLATELETS: 397 10*3/uL (ref 150–400)
RBC: 4.91 MIL/uL (ref 3.87–5.11)
RDW: 13.2 % (ref 11.5–15.5)
WBC: 12.4 10*3/uL — AB (ref 4.0–10.5)

## 2016-09-23 LAB — COMPREHENSIVE METABOLIC PANEL
ALK PHOS: 56 U/L (ref 38–126)
ALT: 16 U/L (ref 14–54)
AST: 19 U/L (ref 15–41)
Albumin: 4.7 g/dL (ref 3.5–5.0)
Anion gap: 9 (ref 5–15)
BILIRUBIN TOTAL: 0.5 mg/dL (ref 0.3–1.2)
BUN: 9 mg/dL (ref 6–20)
CALCIUM: 9.8 mg/dL (ref 8.9–10.3)
CO2: 26 mmol/L (ref 22–32)
Chloride: 103 mmol/L (ref 101–111)
Creatinine, Ser: 0.85 mg/dL (ref 0.44–1.00)
Glucose, Bld: 133 mg/dL — ABNORMAL HIGH (ref 65–99)
Potassium: 3.2 mmol/L — ABNORMAL LOW (ref 3.5–5.1)
Sodium: 138 mmol/L (ref 135–145)
TOTAL PROTEIN: 8.7 g/dL — AB (ref 6.5–8.1)

## 2016-09-23 LAB — LIPASE, BLOOD: Lipase: 19 U/L (ref 11–51)

## 2016-09-23 MED ORDER — KETOROLAC TROMETHAMINE 30 MG/ML IJ SOLN
30.0000 mg | Freq: Once | INTRAMUSCULAR | Status: AC
  Administered 2016-09-23: 30 mg via INTRAVENOUS
  Filled 2016-09-23: qty 1

## 2016-09-23 MED ORDER — SODIUM CHLORIDE 0.9 % IV SOLN
3.0000 g | Freq: Four times a day (QID) | INTRAVENOUS | Status: DC
  Administered 2016-09-23: 3 g via INTRAVENOUS
  Filled 2016-09-23: qty 3

## 2016-09-23 MED ORDER — IOPAMIDOL (ISOVUE-300) INJECTION 61%
INTRAVENOUS | Status: AC
  Filled 2016-09-23: qty 30

## 2016-09-23 MED ORDER — HYDROMORPHONE HCL 1 MG/ML IJ SOLN
1.0000 mg | Freq: Once | INTRAMUSCULAR | Status: AC
  Administered 2016-09-23: 1 mg via INTRAVENOUS
  Filled 2016-09-23: qty 1

## 2016-09-23 MED ORDER — IOPAMIDOL (ISOVUE-300) INJECTION 61%
100.0000 mL | Freq: Once | INTRAVENOUS | Status: AC | PRN
  Administered 2016-09-23: 100 mL via INTRAVENOUS

## 2016-09-23 MED ORDER — ONDANSETRON HCL 4 MG/2ML IJ SOLN
4.0000 mg | Freq: Once | INTRAMUSCULAR | Status: AC
  Administered 2016-09-23: 4 mg via INTRAVENOUS
  Filled 2016-09-23: qty 2

## 2016-09-23 MED ORDER — SODIUM CHLORIDE 0.9 % IJ SOLN
INTRAMUSCULAR | Status: AC
  Administered 2016-09-23: 1000 mL via INTRAVENOUS
  Filled 2016-09-23: qty 50

## 2016-09-23 MED ORDER — SODIUM CHLORIDE 0.9 % IV SOLN
INTRAVENOUS | Status: DC
  Administered 2016-09-23: 1000 mL via INTRAVENOUS

## 2016-09-23 MED ORDER — POTASSIUM CHLORIDE CRYS ER 20 MEQ PO TBCR
40.0000 meq | EXTENDED_RELEASE_TABLET | Freq: Once | ORAL | Status: AC
  Administered 2016-09-23: 40 meq via ORAL
  Filled 2016-09-23: qty 2

## 2016-09-23 MED ORDER — DIPHENHYDRAMINE HCL 50 MG/ML IJ SOLN
12.5000 mg | Freq: Once | INTRAMUSCULAR | Status: AC
  Administered 2016-09-23: 12.5 mg via INTRAVENOUS
  Filled 2016-09-23: qty 1

## 2016-09-23 MED ORDER — IOPAMIDOL (ISOVUE-300) INJECTION 61%
INTRAVENOUS | Status: AC
  Filled 2016-09-23: qty 100

## 2016-09-23 MED ORDER — LORAZEPAM 2 MG/ML IJ SOLN
0.5000 mg | Freq: Once | INTRAMUSCULAR | Status: AC
  Administered 2016-09-23: 0.5 mg via INTRAVENOUS
  Filled 2016-09-23: qty 1

## 2016-09-23 NOTE — ED Notes (Signed)
I attempted to collect labs however I was unsuccessful

## 2016-09-23 NOTE — ED Triage Notes (Signed)
Pt with dx diverticulitis x 2 weeks ago.  Llq abdominal pain same as before.  Started having pain again today which increased.  Just finished her abx.

## 2016-09-23 NOTE — ED Notes (Signed)
Patient transported to x-ray. ?

## 2016-09-23 NOTE — ED Provider Notes (Signed)
[facility] DEPT Provider Note   CSN: [id number] Arrival date & time: 09/23/16  1735     History   Chief Complaint Chief Complaint  Patient presents with  . Abdominal Pain    HPI Joy Cook is a 51 y.o. female.  52 year old female presents with 2 weeks of left lower quadrant abdominal pain which is been worse over the past 2 days. She has had nausea but no vomiting. No fever or chills. She was diagnosed with diverticulitis and completed a full course of therapy. Denies any urinary symptoms. Pain is characterized as sharp and worse with movement. She denies any vaginal bleeding or discharge. Denies any rectal bleeding nothing makes her symptoms better.      Past Medical History:  Diagnosis Date  . Asthma   . GERD (gastroesophageal reflux disease)   . Hypothyroidism   . Thyroid disease     There are no active problems to display for this patient.   Past Surgical History:  Procedure Laterality Date  . BREAST REDUCTION SURGERY    . BREAST SURGERY    . BREAST SURGERY  LUMP REMOVED   1988  . CESAREAN SECTION    . CESAREAN SECTION     X3  . COSMETIC SURGERY    . DILATATION & CURRETTAGE/HYSTEROSCOPY WITH RESECTOCOPE N/A 05/03/2014   Procedure: [place];  Surgeon: Thurnell Lose, MD;  Location: [facility];  Service: Gynecology;  Laterality: N/A;  . HERNIA REPAIR    . KNEE ARTHROSCOPY     LEFT AND RIGHT  . SHOULDER ARTHROSCOPY    . SHOULDER SURGERY Left   . tummy tuck  2010    OB History    Gravida Para Term Preterm AB Living   0 0 0 0 0     SAB TAB Ectopic Multiple Live Births   0 0 0           Home Medications    Prior to Admission medications   Medication Sig Start Date End Date Taking? Authorizing Provider  amLODipine (NORVASC) 5 MG tablet Take 1 tablet by mouth daily. 02/19/15   Historical Provider, MD  cetirizine (ZYRTEC ALLERGY) 10 MG tablet Take 10 mg by mouth daily as needed for allergies.    Historical  Provider, MD  ciprofloxacin (CIPRO) 500 MG tablet Take 1 tablet (500 mg total) by mouth every 12 (twelve) hours. 09/10/16   Merryl Hacker, MD  diphenhydrAMINE (BENADRYL) 25 mg capsule Take 25-50 mg by mouth daily as needed for allergies.    Historical Provider, MD  ferrous fumarate (HEMOCYTE - 106 MG FE) 325 (106 FE) MG TABS tablet Take 1 tablet by mouth.    Historical Provider, MD  fluticasone (FLOVENT HFA) 110 MCG/ACT inhaler Inhale into the lungs 2 (two) times daily.    Historical Provider, MD  gabapentin (NEURONTIN) 100 MG capsule Take 100 mg by mouth 3 (three) times daily.    Historical Provider, MD  hydrochlorothiazide (HYDRODIURIL) 25 MG tablet Take 1 tablet by mouth daily. 08/18/16   Historical Provider, MD  ibuprofen (ADVIL,MOTRIN) 600 MG tablet Take 1 tablet (600 mg total) by mouth every 6 (six) hours as needed. Patient not taking: Reported on 09/10/2016 05/03/14   Thurnell Lose, MD  levothyroxine (SYNTHROID, LEVOTHROID) 100 MCG tablet Take 100 mcg by mouth daily before breakfast.    Historical Provider, MD  medroxyPROGESTERone (PROVERA) 10 MG tablet Take 1 tablet by mouth daily. 09/06/16   Historical Provider, MD  meloxicam (MOBIC) 15 MG  tablet Take 1 tablet by mouth daily. For 10 days monthly 05/06/16 05/06/17  Historical Provider, MD  metroNIDAZOLE (FLAGYL) 500 MG tablet Take 1 tablet (500 mg total) by mouth 2 (two) times daily. 09/10/16   Merryl Hacker, MD  norethindrone-ethinyl estradiol-iron (ESTROSTEP FE,TILIA FE,TRI-LEGEST FE) 1-20/1-30/1-35 MG-MCG tablet Take 1 tablet by mouth daily.    Historical Provider, MD  ondansetron (ZOFRAN ODT) 4 MG disintegrating tablet Take 1 tablet (4 mg total) by mouth every 8 (eight) hours as needed for nausea or vomiting. 09/10/16   Merryl Hacker, MD  OVER THE COUNTER MEDICATION Apply 1 application topically 3 (three) times daily. Product called atomic bomb creamogestic that contains methyl salicylate and capsaicin.  Pt will stop on using 04-21-14.     Historical Provider, MD  oxyCODONE-acetaminophen (PERCOCET/ROXICET) 5-325 MG tablet Take 1-2 tablets by mouth every 6 (six) hours as needed for severe pain. 09/10/16   Merryl Hacker, MD  pantoprazole (PROTONIX) 20 MG tablet Take 1 tablet (20 mg total) by mouth daily. 07/29/13   Evelina Bucy, MD  traMADol (ULTRAM) 50 MG tablet Take 0.5 tablets (25 mg total) by mouth every 6 (six) hours as needed for moderate pain. 05/03/14   Thurnell Lose, MD    Family History Family History  Problem Relation Age of Onset  . Hypertension Other     Social History Social History  Substance Use Topics  . Smoking status: Never Smoker  . Smokeless tobacco: Never Used  . Alcohol use No     Allergies   Hydrocodone; Shellfish allergy; Codeine; Hydrocodone; and Shellfish allergy   Review of Systems Review of Systems  All other systems reviewed and are negative.    Physical Exam Updated Vital Signs BP 153/95 (BP Location: Left Arm)   Pulse 92   Temp 98.8 F (37.1 C) (Oral)   Resp 18   SpO2 97%   Physical Exam  Constitutional: She is oriented to person, place, and time. She appears well-developed and well-nourished.  Non-toxic appearance. No distress.  HENT:  Head: Normocephalic and atraumatic.  Eyes: Conjunctivae, EOM and lids are normal. Pupils are equal, round, and reactive to light.  Neck: Normal range of motion. Neck supple. No tracheal deviation present. No thyroid mass present.  Cardiovascular: Normal rate, regular rhythm and normal heart sounds.  Exam reveals no gallop.   No murmur heard. Pulmonary/Chest: Effort normal and breath sounds normal. No stridor. No respiratory distress. She has no decreased breath sounds. She has no wheezes. She has no rhonchi. She has no rales.  Abdominal: Soft. Normal appearance and bowel sounds are normal. She exhibits no distension. There is tenderness in the left lower quadrant. There is guarding. There is no rigidity, no rebound and no CVA tenderness.     Musculoskeletal: Normal range of motion. She exhibits no edema or tenderness.  Neurological: She is alert and oriented to person, place, and time. She has normal strength. No cranial nerve deficit or sensory deficit. GCS eye subscore is 4. GCS verbal subscore is 5. GCS motor subscore is 6.  Skin: Skin is warm and dry. No abrasion and no rash noted.  Psychiatric: She has a normal mood and affect. Her speech is normal and behavior is normal.  Nursing note and vitals reviewed.    ED Treatments / Results  Labs (all labs ordered are listed, but only abnormal results are displayed) Labs Reviewed  LIPASE, BLOOD  COMPREHENSIVE METABOLIC PANEL  CBC  URINALYSIS, ROUTINE W REFLEX MICROSCOPIC  EKG  EKG Interpretation None       Radiology No results found.  Procedures Procedures (including critical care time)  Medications Ordered in ED Medications  0.9 %  sodium chloride infusion (not administered)  HYDROmorphone (DILAUDID) injection 1 mg (not administered)  ondansetron (ZOFRAN) injection 4 mg (not administered)  diphenhydrAMINE (BENADRYL) injection 12.5 mg (not administered)     Initial Impression / Assessment and Plan / ED Course  I have reviewed the triage vital signs and the nursing notes.  Pertinent labs & imaging results that were available during my care of the patient were reviewed by me and considered in my medical decision making (see chart for details).   patient given dose of IV Unasyn here. She was medicated twice with IV opiates and still has pain. Will admit for observation for pain management.  Final Clinical Impressions(s) / ED Diagnoses   Final diagnoses:  None    New Prescriptions New Prescriptions   No medications on file     Lacretia Leigh, MD 09/24/16 0045

## 2016-09-24 ENCOUNTER — Other Ambulatory Visit: Payer: Self-pay

## 2016-09-24 ENCOUNTER — Encounter (HOSPITAL_COMMUNITY): Payer: Self-pay | Admitting: Family Medicine

## 2016-09-24 DIAGNOSIS — G894 Chronic pain syndrome: Secondary | ICD-10-CM | POA: Diagnosis not present

## 2016-09-24 DIAGNOSIS — R109 Unspecified abdominal pain: Secondary | ICD-10-CM | POA: Diagnosis not present

## 2016-09-24 DIAGNOSIS — K5732 Diverticulitis of large intestine without perforation or abscess without bleeding: Secondary | ICD-10-CM | POA: Diagnosis present

## 2016-09-24 DIAGNOSIS — E876 Hypokalemia: Secondary | ICD-10-CM | POA: Diagnosis present

## 2016-09-24 DIAGNOSIS — J45909 Unspecified asthma, uncomplicated: Secondary | ICD-10-CM | POA: Diagnosis present

## 2016-09-24 DIAGNOSIS — J4540 Moderate persistent asthma, uncomplicated: Secondary | ICD-10-CM

## 2016-09-24 DIAGNOSIS — E039 Hypothyroidism, unspecified: Secondary | ICD-10-CM

## 2016-09-24 DIAGNOSIS — K219 Gastro-esophageal reflux disease without esophagitis: Secondary | ICD-10-CM | POA: Diagnosis present

## 2016-09-24 LAB — RAPID URINE DRUG SCREEN, HOSP PERFORMED
Amphetamines: NOT DETECTED
BENZODIAZEPINES: NOT DETECTED
Barbiturates: NOT DETECTED
COCAINE: NOT DETECTED
Opiates: NOT DETECTED
Tetrahydrocannabinol: NOT DETECTED

## 2016-09-24 LAB — GLUCOSE, CAPILLARY: Glucose-Capillary: 129 mg/dL — ABNORMAL HIGH (ref 65–99)

## 2016-09-24 LAB — BASIC METABOLIC PANEL
ANION GAP: 7 (ref 5–15)
BUN: 9 mg/dL (ref 6–20)
CALCIUM: 8.8 mg/dL — AB (ref 8.9–10.3)
CO2: 24 mmol/L (ref 22–32)
Chloride: 109 mmol/L (ref 101–111)
Creatinine, Ser: 0.82 mg/dL (ref 0.44–1.00)
GLUCOSE: 119 mg/dL — AB (ref 65–99)
Potassium: 3.9 mmol/L (ref 3.5–5.1)
Sodium: 140 mmol/L (ref 135–145)

## 2016-09-24 LAB — MAGNESIUM: Magnesium: 2.6 mg/dL — ABNORMAL HIGH (ref 1.7–2.4)

## 2016-09-24 MED ORDER — MAGNESIUM SULFATE 2 GM/50ML IV SOLN
2.0000 g | Freq: Once | INTRAVENOUS | Status: AC
  Administered 2016-09-24: 2 g via INTRAVENOUS
  Filled 2016-09-24: qty 50

## 2016-09-24 MED ORDER — ONDANSETRON HCL 4 MG PO TABS: 4.0000 mg | ORAL_TABLET | Freq: Four times a day (QID) | ORAL | 0 refills | Status: DC | PRN

## 2016-09-24 MED ORDER — POLYETHYLENE GLYCOL 3350 17 G PO PACK: 17.0000 g | PACK | Freq: Every day | ORAL | 0 refills | Status: DC

## 2016-09-24 MED ORDER — POLYETHYLENE GLYCOL 3350 17 G PO PACK
17.0000 g | PACK | Freq: Two times a day (BID) | ORAL | Status: DC
  Administered 2016-09-24: 17 g via ORAL

## 2016-09-24 MED ORDER — POLYETHYLENE GLYCOL 3350 17 G PO PACK
17.0000 g | PACK | Freq: Two times a day (BID) | ORAL | Status: DC
  Filled 2016-09-24: qty 1

## 2016-09-24 MED ORDER — KETOROLAC TROMETHAMINE 30 MG/ML IJ SOLN
30.0000 mg | Freq: Four times a day (QID) | INTRAMUSCULAR | Status: DC | PRN
  Administered 2016-09-24 (×2): 30 mg via INTRAVENOUS
  Filled 2016-09-24 (×2): qty 1

## 2016-09-24 MED ORDER — SODIUM CHLORIDE 0.9 % IV SOLN
INTRAVENOUS | Status: AC
  Administered 2016-09-24: 03:00:00 via INTRAVENOUS
  Filled 2016-09-24: qty 1000

## 2016-09-24 MED ORDER — PANTOPRAZOLE SODIUM 40 MG PO TBEC
40.0000 mg | DELAYED_RELEASE_TABLET | Freq: Two times a day (BID) | ORAL | Status: DC
  Administered 2016-09-24: 40 mg via ORAL
  Filled 2016-09-24: qty 1

## 2016-09-24 MED ORDER — PANTOPRAZOLE SODIUM 40 MG PO TBEC: 40.0000 mg | DELAYED_RELEASE_TABLET | Freq: Two times a day (BID) | ORAL | 0 refills | Status: DC

## 2016-09-24 MED ORDER — ONDANSETRON HCL 4 MG PO TABS
4.0000 mg | ORAL_TABLET | Freq: Four times a day (QID) | ORAL | Status: DC | PRN
  Filled 2016-09-24: qty 1

## 2016-09-24 MED ORDER — NORETHINDRON-ETHINYL ESTRAD-FE 1-20/1-30/1-35 MG-MCG PO TABS: 1.0000 | ORAL_TABLET | Freq: Every day | ORAL | Status: DC

## 2016-09-24 MED ORDER — ACETAMINOPHEN 650 MG RE SUPP: 650.0000 mg | Freq: Four times a day (QID) | RECTAL | Status: DC | PRN

## 2016-09-24 MED ORDER — DIPHENHYDRAMINE HCL 25 MG PO CAPS: 25.0000 mg | ORAL_CAPSULE | Freq: Every day | ORAL | Status: DC | PRN

## 2016-09-24 MED ORDER — GABAPENTIN 100 MG PO CAPS
100.0000 mg | ORAL_CAPSULE | Freq: Three times a day (TID) | ORAL | Status: DC
  Filled 2016-09-24: qty 1

## 2016-09-24 MED ORDER — ENOXAPARIN SODIUM 40 MG/0.4ML ~~LOC~~ SOLN
40.0000 mg | SUBCUTANEOUS | Status: DC
  Administered 2016-09-24: 40 mg via SUBCUTANEOUS
  Filled 2016-09-24: qty 0.4

## 2016-09-24 MED ORDER — AMLODIPINE BESYLATE 5 MG PO TABS
5.0000 mg | ORAL_TABLET | Freq: Every day | ORAL | Status: DC
  Administered 2016-09-24: 5 mg via ORAL
  Filled 2016-09-24: qty 1

## 2016-09-24 MED ORDER — AMOXICILLIN-POT CLAVULANATE 875-125 MG PO TABS: 1.0000 | ORAL_TABLET | Freq: Two times a day (BID) | ORAL | 0 refills | Status: AC

## 2016-09-24 MED ORDER — GABAPENTIN 400 MG PO CAPS
1200.0000 mg | ORAL_CAPSULE | Freq: Three times a day (TID) | ORAL | Status: DC
  Administered 2016-09-24: 1200 mg via ORAL
  Filled 2016-09-24: qty 3

## 2016-09-24 MED ORDER — FLUTICASONE PROPIONATE HFA 110 MCG/ACT IN AERO: 1.0000 | INHALATION_SPRAY | Freq: Two times a day (BID) | RESPIRATORY_TRACT | Status: DC

## 2016-09-24 MED ORDER — GABAPENTIN 600 MG PO TABS
1200.0000 mg | ORAL_TABLET | Freq: Three times a day (TID) | ORAL | Status: DC
  Filled 2016-09-24: qty 2

## 2016-09-24 MED ORDER — POLYETHYLENE GLYCOL 3350 17 G PO PACK
34.0000 g | PACK | Freq: Two times a day (BID) | ORAL | Status: DC
  Administered 2016-09-24: 34 g via ORAL

## 2016-09-24 MED ORDER — PANTOPRAZOLE SODIUM 20 MG PO TBEC
20.0000 mg | DELAYED_RELEASE_TABLET | Freq: Every day | ORAL | Status: DC
  Filled 2016-09-24: qty 1

## 2016-09-24 MED ORDER — ONDANSETRON HCL 4 MG/2ML IJ SOLN
4.0000 mg | Freq: Four times a day (QID) | INTRAMUSCULAR | Status: DC | PRN
  Administered 2016-09-24 (×2): 4 mg via INTRAVENOUS
  Filled 2016-09-24 (×2): qty 2

## 2016-09-24 MED ORDER — ALBUTEROL SULFATE HFA 108 (90 BASE) MCG/ACT IN AERS: 1.0000 | INHALATION_SPRAY | RESPIRATORY_TRACT | Status: DC | PRN

## 2016-09-24 MED ORDER — POTASSIUM CHLORIDE IN NACL 20-0.9 MEQ/L-% IV SOLN: INTRAVENOUS | Status: DC

## 2016-09-24 MED ORDER — ALBUTEROL SULFATE (2.5 MG/3ML) 0.083% IN NEBU: 2.5000 mg | INHALATION_SOLUTION | RESPIRATORY_TRACT | Status: DC | PRN

## 2016-09-24 MED ORDER — VITAMIN D3 25 MCG (1000 UNIT) PO TABS
1000.0000 [IU] | ORAL_TABLET | Freq: Every day | ORAL | Status: DC
  Filled 2016-09-24: qty 1

## 2016-09-24 MED ORDER — ACETAMINOPHEN 325 MG PO TABS: 650.0000 mg | ORAL_TABLET | Freq: Four times a day (QID) | ORAL | Status: DC | PRN

## 2016-09-24 MED ORDER — LORATADINE 10 MG PO TABS
10.0000 mg | ORAL_TABLET | Freq: Every day | ORAL | Status: DC
  Administered 2016-09-24: 10 mg via ORAL
  Filled 2016-09-24: qty 1

## 2016-09-24 MED ORDER — PIPERACILLIN-TAZOBACTAM 3.375 G IVPB
3.3750 g | Freq: Three times a day (TID) | INTRAVENOUS | Status: DC
  Administered 2016-09-24 (×2): 3.375 g via INTRAVENOUS
  Filled 2016-09-24 (×2): qty 50

## 2016-09-24 MED ORDER — SENNOSIDES-DOCUSATE SODIUM 8.6-50 MG PO TABS: 1.0000 | ORAL_TABLET | Freq: Two times a day (BID) | ORAL | Status: DC

## 2016-09-24 MED ORDER — MELOXICAM 15 MG PO TABS: 15.0000 mg | ORAL_TABLET | Freq: Every day | ORAL | Status: DC

## 2016-09-24 MED ORDER — LEVOTHYROXINE SODIUM 100 MCG PO TABS
100.0000 ug | ORAL_TABLET | Freq: Every day | ORAL | Status: DC
  Administered 2016-09-24: 100 ug via ORAL
  Filled 2016-09-24: qty 1

## 2016-09-24 MED ORDER — TRAMADOL HCL 50 MG PO TABS
50.0000 mg | ORAL_TABLET | Freq: Four times a day (QID) | ORAL | Status: DC | PRN
  Administered 2016-09-24 (×2): 100 mg via ORAL
  Administered 2016-09-24: 50 mg via ORAL
  Filled 2016-09-24: qty 1
  Filled 2016-09-24 (×2): qty 2

## 2016-09-24 MED ORDER — BUDESONIDE 0.25 MG/2ML IN SUSP
0.2500 mg | Freq: Two times a day (BID) | RESPIRATORY_TRACT | Status: DC
  Filled 2016-09-24: qty 2

## 2016-09-24 MED ORDER — FERROUS FUMARATE 324 (106 FE) MG PO TABS
1.0000 | ORAL_TABLET | Freq: Every day | ORAL | Status: DC
  Filled 2016-09-24 (×2): qty 1

## 2016-09-24 NOTE — Progress Notes (Signed)
Addendum: Pt says that she normally does take hydrocodone and tramadol at home for her arthritis.  I told her that she should take those as prescribed by her primary physicians.  She should follow up with them as soon as possible.   No new prescriptions for any pain meds prescribed.  Pt verbalized understanding.    Murvin Natal, MD

## 2016-09-24 NOTE — Progress Notes (Signed)
PT Cancellation Note  Patient Details Name: Joy Cook MRN: [id number] DOB: March 16, 1966   Cancelled Treatment:    Reason Eval/Treat Not Completed: PT screened, no needs identified, will sign off Observed pt ambulating in hallway with nursing.  Pt mobilizing slowly but without unsteadiness or LOB.  Do not feel pt needs f/u PT at this time.  Pt to discharge today.   [name],KATHrine E 09/24/2016, 4:22 PM Carmelia Bake, PT, DPT 09/24/2016 Pager: [phone]

## 2016-09-24 NOTE — Progress Notes (Signed)
Patient reports that she takes 1200mg  gabapentin TID at home. Current home list reads 100mg  TID. MD and pharmacy notified.

## 2016-09-24 NOTE — Progress Notes (Signed)
09/23/2016  7:21 PM  09/24/2016 10:41 AM  Joy Cook was seen and examined.  The H&P by the admitting provider , orders, imaging was reviewed.  Please see orders.  Will continue to follow.   Murvin Natal, MD [facility]

## 2016-09-24 NOTE — Discharge Summary (Signed)
Physician Discharge Summary  Joy Cook [id number] DOB: 04-22-1966 DOA: 09/23/2016  PCP: Gerrit Heck, MD  Admit date: 09/23/2016 Discharge date: 09/24/2016  Admitted From: Home  Disposition:  Home   Recommendations for Outpatient Follow-up:  1. Follow up with PCP in 2 days for recheck.    Discharge Condition: STABLE  CODE STATUS: FULL  Diet recommendation: SOFT DIET   Brief/Interim Summary: HPI: Joy Cook is a 51 y.o. female with medical history significant for persistent asthma, GERD, hypothyroidism, and chronic pain syndrome who presents to the emergency department with severe left lower quadrant pain and nausea. Patient presented to the emergency department with very similar complaints on 09/10/2016, was found to have acute diverticulitis involving the descending colon, and was discharged home with ciprofloxacin and Flagyl. She reports that the pain had improved significantly, but not resolved, before worsening yesterday and becoming unbearable today. She describes the pain as severe, sharp, occasional radiation to the mid back, worse with palpation or movement, and with no alleviating factors identified. Patient endorses some mild associated nausea, but denies vomiting or diarrhea. There has been no melena or hematochezia. She reports completing her antibiotics on the day of admission.she denies any flank pain or dysuria. She continues to pass flatus.  ED Course: Upon arrival to the ED, patient is found to be afebrile, saturating well on room air, and with vital signs stable. Chest x-ray is negative for acute cardiopulmonary disease and KUB reveals moderate colonic stool burden along the right colon with no obstruction. Chemistry panels notable for a hypokalemia to 3.2 and CBC features a leukocytosis to 12,400, improved from 09/10/2016 when she was diagnosed with acute diverticulitis. Urinalysis is notable for small hemoglobin and a low specific gravity. CT of the  abdomen and pelvis was obtained and notable for persistent, but improving inflammatory changes around the descending colon, consistent with improving diverticulitis without abscess. Patient was given an empiric dose of Unasyn in the emergency department and potassium was replaced with 40 mEq orally. She was given symptomatic care with IV Dilaudid, Toradol, Ativan, and Zofran. Despite these measures, she continued to complain of unbearable pain. She has remained hemodynamically stable and in no apparent respiratory distress and will be observed on the medical/surgical unit for ongoing evaluation and management.  1. Diverticulitis with LLQ pain  - Pt presented with re-worsening in her LLQ pain which had been improving with Cipro and Flagyl for diverticulitis  - Pain is same in character and location, just more severe  - CT repeated on admission demonstrates interval improvement in diverticulitis; WBC has also improved  - Abd is soft without peritoneal signs  - Plain film xray of abdomen reveals constipation and diffuse gas which likely is cause of her pain, given laxatives (Pt refused some doses in hospital).  - She was treated with IV Dilaudid and IV Toradol in the ED; Pain improved with conservative supportive therapy and IV zosyn.  - Discharge on oral augmentin with close follow up with PCP.   - Tramadol for moderate pain in hospital, Toradol for severe pain, avoiding narcotics.     - SOFT DIET Recommended  2. Chronic pain syndrome  - Chronic pain attributed to bilateral hands and forearms with unclear diagnosis; has been evaluated by neurology with normal conduction studies; evaluated by hand surgery, but told no surgical treatment options.    - Follows with pain management, and per notes is considered high-risk d/t recent refusal of UDS  - She is managed with Mobic and  Neurontin; will continue Neurontin, hold Mobic while on Toradol - Avoiding narcotics as above  3. Hypokalemia  - Potassium  3.2 on admission  - Possibly secondary to HCTZ, will hold - 40 mEq oral potassium given in ED and KCl added to IVF; magnesium given empirically  - Repleted.    4. GERD - No EGD report on file - Managed with daily Protonix at home, but symptoms not controlled, changed to 40 mg BID of protonix.   5. Hypothyroidism   - Appears stable, continue Synthroid   6. Asthma  - Moderate-persistent; stable on admission  - Continue scheduled Flovent, prn albuterol   DVT prophylaxis: sq Lovenox  Code Status: Full  Family Communication: Husband updated at bedside Disposition Plan: Home  Consults called: None Admission status: Observation   Discharge Diagnoses:  Principal Problem:   Intractable abdominal pain Active Problems:   Diverticulitis large intestine   Chronic pain syndrome   GERD (gastroesophageal reflux disease)   Asthma   Hypothyroidism   Hypokalemia  Discharge Instructions  Discharge Instructions    Increase activity slowly    Complete by:  As directed      Allergies as of 09/24/2016      Reactions   Shellfish Allergy Anaphylaxis, Hives, Itching   Tandem Plus [fefum-fepo-fa-b Cmp-c-zn-mn-cu] Nausea And Vomiting, Other (See Comments)   Dizzy and fatigue    Tandem [ferrous Fum-iron Polysacch] Nausea And Vomiting, Other (See Comments)   Dizzy and fatigue        Medication List    STOP taking these medications   meloxicam 15 MG tablet Commonly known as:  MOBIC   predniSONE 20 MG tablet Commonly known as:  DELTASONE     TAKE these medications   amLODipine 5 MG tablet Commonly known as:  NORVASC Take 1 tablet by mouth daily.   amoxicillin-clavulanate 875-125 MG tablet Commonly known as:  AUGMENTIN Take 1 tablet by mouth 2 (two) times daily.   cholecalciferol 1000 units tablet Commonly known as:  VITAMIN D Take 2,000 Units by mouth daily.   diphenhydrAMINE 25 mg capsule Commonly known as:  BENADRYL Take 25-50 mg by mouth daily as needed for  allergies.   Ferrous Sulfate 27 MG Tabs Take 1 tablet by mouth daily.   fluticasone 110 MCG/ACT inhaler Commonly known as:  FLOVENT HFA Inhale 1 puff into the lungs 2 (two) times daily.   fluticasone 50 MCG/ACT nasal spray Commonly known as:  FLONASE Place 1 spray into both nostrils 2 (two) times daily.   gabapentin 600 MG tablet Commonly known as:  NEURONTIN Take 1,200 mg by mouth 3 (three) times daily.   hydrochlorothiazide 25 MG tablet Commonly known as:  HYDRODIURIL Take 1 tablet by mouth daily.   levothyroxine 100 MCG tablet Commonly known as:  SYNTHROID, LEVOTHROID Take 100 mcg by mouth daily before breakfast.   medroxyPROGESTERone 10 MG tablet Commonly known as:  PROVERA Take 10 mg by mouth See admin instructions. Takes only 10 days a month, starts on the 1st and ends on the 10 th   ondansetron 4 MG tablet Commonly known as:  ZOFRAN Take 1 tablet (4 mg total) by mouth every 6 (six) hours as needed for nausea.   pantoprazole 40 MG tablet Commonly known as:  PROTONIX Take 1 tablet (40 mg total) by mouth 2 (two) times daily before a meal. What changed:  medication strength  how much to take  when to take this   polyethylene glycol packet Commonly known as:  MIRALAX /  GLYCOLAX Take 17 g by mouth daily.   ZYRTEC ALLERGY 10 MG tablet Generic drug:  cetirizine Take 10 mg by mouth daily as needed for allergies.      Follow-up Information    Gerrit Heck, MD. Schedule an appointment as soon as possible for a visit in 2 day(s).   Specialty:  Family Medicine Contact information: [place] [address] [phone]          Allergies  Allergen Reactions  . Shellfish Allergy Anaphylaxis, Hives and Itching  . Tandem Plus [Fefum-Fepo-Fa-B Cmp-C-Zn-Mn-Cu] Nausea And Vomiting and Other (See Comments)    Dizzy and fatigue   . Tandem [Ferrous Fum-Iron Polysacch] Nausea And Vomiting and Other (See Comments)    Dizzy and fatigue       Procedures/Studies: Dg Chest 2 View  Result Date: 09/10/2016 CLINICAL DATA:  Acute onset chest pain this morning, LEFT flank pain. EXAM: CHEST  2 VIEW COMPARISON:  Chest radiograph August 29, 2015 FINDINGS: Cardiomediastinal silhouette is normal. No pleural effusions or focal consolidations. Trachea projects midline and there is no pneumothorax. Soft tissue planes and included osseous structures are non-suspicious. Scattered subcentimeter rounded densities in the abdomen most compatible with diverticulitis and retained contrast. Hair artifact projects in upper chest. IMPRESSION: No acute cardiopulmonary process. Electronically Signed   By: Elon Alas M.D.   On: 09/10/2016 02:23   Ct Abdomen Pelvis W Contrast  Result Date: 09/23/2016 CLINICAL DATA:  Diverticulitis 2 weeks ago. Left lower quadrant abdominal pain. Pain increased today. Just finished antibiotics. EXAM: CT ABDOMEN AND PELVIS WITH CONTRAST TECHNIQUE: Multidetector CT imaging of the abdomen and pelvis was performed using the standard protocol following bolus administration of intravenous contrast. CONTRAST:  144mL ISOVUE-300 IOPAMIDOL (ISOVUE-300) INJECTION 61% COMPARISON:  09/10/2016 FINDINGS: Lower chest: Lung bases are clear. Hepatobiliary: Benign-appearing cyst in segment 2 of the liver, measuring 1.9 cm diameter. No change since prior study. Subcentimeter cyst in the segment 3. Gallbladder and bile ducts are unremarkable. Pancreas: Unremarkable. No pancreatic ductal dilatation or surrounding inflammatory changes. Spleen: Normal in size without focal abnormality. Adrenals/Urinary Tract: Adrenal glands are unremarkable. Kidneys are normal, without renal calculi, focal lesion, or hydronephrosis. Bladder is unremarkable. Stomach/Bowel: Stomach and small bowel are unremarkable. Scattered gas and stool throughout the colon. No colonic distention. There is infiltration in the pericolonic fat around the mid and lower descending colon  consistent with diverticulitis. Inflammatory reaction is decreased since previous study. No abscess. Appendix is normal. Vascular/Lymphatic: No significant vascular findings are present. No enlarged abdominal or pelvic lymph nodes. Reproductive: Uterus and bilateral adnexa are unremarkable. Other: No free air or free fluid in the abdomen or pelvis. Scarring in the anterior abdominal wall is likely postoperative. Musculoskeletal: No acute or significant osseous findings. IMPRESSION: Persistent but decreasing inflammatory changes around the descending colon consistent with improvement of diverticulitis. No abscess. Electronically Signed   By: Lucienne Capers M.D.   On: 09/23/2016 22:04   Ct Abdomen Pelvis W Contrast  Result Date: 09/05/2016 CLINICAL DATA:  Abdominal and epigastric pain, nausea, and bloating for 3 months. EXAM: CT ABDOMEN AND PELVIS WITH CONTRAST TECHNIQUE: Multidetector CT imaging of the abdomen and pelvis was performed using the standard protocol following bolus administration of intravenous contrast. CONTRAST:  100 mL Isovue 300 COMPARISON:  None. FINDINGS: Lower Chest: Lung bases are clear. 1.6 cm soft tissue mass seen in the inferior right breast. This has fairly smooth margins, which favors a fibroadenoma although breast carcinoma cannot definitely be excluded. Hepatobiliary:  No masses identified. 2 cm left hepatic lobe cyst is seen. A few other tiny sub-cm low-attenuation lesions which are too small to characterize but most likely represent additional tiny cysts. Gallbladder is unremarkable. Pancreas:  No mass or inflammatory changes. Spleen: Within normal limits in size and appearance. Adrenals/Urinary Tract: No masses identified. No evidence of hydronephrosis. Stomach/Bowel: No evidence of obstruction, inflammatory process or abnormal fluid collections. Normal appendix visualized. Mild diverticulosis of descending colon is seen, however there is no evidence of diverticulitis.  Vascular/Lymphatic: No pathologically enlarged lymph nodes. No abdominal aortic aneurysm. Reproductive: Uterus and adnexal regions are unremarkable. No mass identified. Other:  None. Musculoskeletal:  No suspicious bone lesions identified. IMPRESSION: Colonic diverticulosis. No radiographic evidence of diverticulitis or other acute findings within the abdomen or pelvis. 1.6 cm soft tissue mass in inferior right breast. Differential diagnosis includes fibroadenoma and breast carcinoma. Recommend diagnostic mammography and/or breast ultrasound for further evaluation. Electronically Signed   By: Earle Gell M.D.   On: 09/05/2016 12:00   Dg Abd Acute W/chest  Result Date: 09/23/2016 CLINICAL DATA:  Left-sided gastric and back pain for 2 weeks. History of diverticulitis diagnosed 2 weeks ago. Chills. EXAM: DG ABDOMEN ACUTE W/ 1V CHEST COMPARISON:  CXR 09/10/2016, CT 09/05/2016 FINDINGS: Heart and mediastinal contours within normal limits. Lungs are clear. Moderate colonic stool burden of along the right colon. No small bowel obstruction. Enteric contrast is seen within the stomach and proximal small bowel. Contrast filled diverticulum noted the descending colon from prior CT. There is no free air. No acute osseous abnormality. No suspicious calculi. IMPRESSION: Moderate colonic stool burden along the right colon. No bowel obstruction nor suspicious calculi. No acute cardiopulmonary disease. Electronically Signed   By: Ashley Royalty M.D.   On: 09/23/2016 20:58   US Breast Ltd Uni Right Inc Axilla  Result Date: 09/15/2016 CLINICAL DATA:  Recent CT scan of the chest showed a mass in the inferior right breast. Patient had an ultrasound-guided core biopsy of the right breast on 08/29/2013 which demonstrated a fibroadenoma. EXAM: 2D DIGITAL DIAGNOSTIC RIGHT MAMMOGRAM WITH CAD AND ADJUNCT TOMO ULTRASOUND RIGHT BREAST COMPARISON:  Previous exam(s). ACR Breast Density Category b: There are scattered areas of fibroglandular  density. FINDINGS: There is a partially imaged mass far posteriorly in the lateral aspect of the right breast. There are no malignant type microcalcifications in the right breast. Mammographic images were processed with CAD. On physical exam, I do not palpate a mass in the 7 o'clock region of the right breast. Targeted ultrasound is performed, showing a well-circumscribed hypoechoic mass in the right breast at 7 o'clock 5 cm from the nipple measuring 1.9 x 1.2 x 2.3 cm. On the prior study dated 08/29/2013 it measured 2.0 x 1.2 x 1.4 cm. Difference in size may technical. IMPRESSION: Probable stable benign fibroadenoma in the 7 o'clock region of the right breast. RECOMMENDATION: Short-term interval followup right breast ultrasound in 6 months is recommended. I have discussed the findings and recommendations with the patient. Results were also provided in writing at the conclusion of the visit. If applicable, a reminder letter will be sent to the patient regarding the next appointment. BI-RADS CATEGORY  3: Probably benign. Electronically Signed   By: Lillia Mountain M.D.   On: 09/15/2016 11:34   Mm Diag Breast Tomo Uni Right  Result Date: 09/15/2016 CLINICAL DATA:  Recent CT scan of the chest showed a mass in the inferior right breast. Patient had an ultrasound-guided core biopsy of the  right breast on 08/29/2013 which demonstrated a fibroadenoma. EXAM: 2D DIGITAL DIAGNOSTIC RIGHT MAMMOGRAM WITH CAD AND ADJUNCT TOMO ULTRASOUND RIGHT BREAST COMPARISON:  Previous exam(s). ACR Breast Density Category b: There are scattered areas of fibroglandular density. FINDINGS: There is a partially imaged mass far posteriorly in the lateral aspect of the right breast. There are no malignant type microcalcifications in the right breast. Mammographic images were processed with CAD. On physical exam, I do not palpate a mass in the 7 o'clock region of the right breast. Targeted ultrasound is performed, showing a well-circumscribed  hypoechoic mass in the right breast at 7 o'clock 5 cm from the nipple measuring 1.9 x 1.2 x 2.3 cm. On the prior study dated 08/29/2013 it measured 2.0 x 1.2 x 1.4 cm. Difference in size may technical. IMPRESSION: Probable stable benign fibroadenoma in the 7 o'clock region of the right breast. RECOMMENDATION: Short-term interval followup right breast ultrasound in 6 months is recommended. I have discussed the findings and recommendations with the patient. Results were also provided in writing at the conclusion of the visit. If applicable, a reminder letter will be sent to the patient regarding the next appointment. BI-RADS CATEGORY  3: Probably benign. Electronically Signed   By: Lillia Mountain M.D.   On: 09/15/2016 11:34   Ct Renal Stone Study  Result Date: 09/10/2016 CLINICAL DATA:  Left flank pain radiating to the left lower quadrant. EXAM: CT ABDOMEN AND PELVIS WITHOUT CONTRAST TECHNIQUE: Multidetector CT imaging of the abdomen and pelvis was performed following the standard protocol without IV contrast. COMPARISON:  CT 5 days prior 09/05/2016 FINDINGS: Lower chest: Lower most lung bases are clear. Right breast nodule on prior exam not included in the field of view. Hepatobiliary: Unchanged cyst in the left lobe of the liver. Additional smaller subcentimeter lesions on prior CT are not visualized given lack of contrast. Gallbladder physiologically distended, no calcified stone. No biliary dilatation. Pancreas: No ductal dilatation or inflammation. Spleen: Normal in size without focal abnormality. Adrenals/Urinary Tract: No renal stones or hydronephrosis. Ureters decompressed without stones along the course. Urinary bladder is physiologically distended. Adrenal glands are normal. Stomach/Bowel: There is an inflamed diverticulum with associated colonic thickening about the mid distal descending colon consistent with acute diverticulitis. This is new from prior exam. No perforation or abscess. Mild pericolonic  edema and trace free fluid in the pericolic gutter. Additional noninflamed colonic diverticula are seen. No small bowel dilatation or inflammation. The appendix is normal. Vascular/Lymphatic: Abdominal aorta is normal in caliber. No adenopathy. Reproductive: Uterus and bilateral adnexa are unremarkable. Other: Postsurgical changes of the anterior abdominal wall. No free air. No intra-abdominal abscess. Musculoskeletal: There are no acute or suspicious osseous abnormalities. IMPRESSION: Acute diverticulitis involving the mid distal descending colon. No perforation or abscess. Electronically Signed   By: Jeb Levering M.D.   On: 09/10/2016 04:54   Subjective: Pt with much less abdominal pain and tolerating soft diet today.    Discharge Exam: Vitals:   09/24/16 0925 09/24/16 1253  BP: 138/89 (!) 149/74  Pulse: 73 74  Resp:  18  Temp:  98.9 F (37.2 C)   Vitals:   09/24/16 0200 09/24/16 0216 09/24/16 0925 09/24/16 1253  BP: 110/72 (!) 165/89 138/89 (!) 149/74  Pulse: 72 80 73 74  Resp:  18  18  Temp:    98.9 F (37.2 C)  TempSrc:  Oral    SpO2: 98% 97%  98%  Weight:  77.5 kg (170 lb 13.7 oz)  General: Pt is alert, awake, not in acute distress Cardiovascular: RRR, S1/S2 +, no rubs, no gallops Respiratory: CTA bilaterally, no wheezing, no rhonchi Abdominal: Soft, mild LLQ tenderness, no guarding, no rebound tenderness, ND, bowel sounds + Extremities: no edema, no cyanosis  The results of significant diagnostics from this hospitalization (including imaging, microbiology, ancillary and laboratory) are listed below for reference.     Microbiology: No results found for this or any previous visit (from the past 240 hour(s)).   Labs: BNP (last 3 results) No results for input(s): BNP in the last 8760 hours. Basic Metabolic Panel:  Recent Labs Lab 09/23/16 2012 09/24/16 0614  NA 138 140  K 3.2* 3.9  CL 103 109  CO2 26 24  GLUCOSE 133* 119*  BUN 9 9  CREATININE 0.85 0.82   CALCIUM 9.8 8.8*  MG  --  2.6*   Liver Function Tests:  Recent Labs Lab 09/23/16 2012  AST 19  ALT 16  ALKPHOS 56  BILITOT 0.5  PROT 8.7*  ALBUMIN 4.7    Recent Labs Lab 09/23/16 2012  LIPASE 19   No results for input(s): AMMONIA in the last 168 hours. CBC:  Recent Labs Lab 09/23/16 2012  WBC 12.4*  HGB 13.3  HCT 39.4  MCV 80.2  PLT 397   Cardiac Enzymes: No results for input(s): CKTOTAL, CKMB, CKMBINDEX, TROPONINI in the last 168 hours. BNP: Invalid input(s): POCBNP CBG:  Recent Labs Lab 09/24/16 0741  GLUCAP 129*   D-Dimer No results for input(s): DDIMER in the last 72 hours. Hgb A1c No results for input(s): HGBA1C in the last 72 hours. Lipid Profile No results for input(s): CHOL, HDL, LDLCALC, TRIG, CHOLHDL, LDLDIRECT in the last 72 hours. Thyroid function studies No results for input(s): TSH, T4TOTAL, T3FREE, THYROIDAB in the last 72 hours.  Invalid input(s): FREET3 Anemia work up No results for input(s): VITAMINB12, FOLATE, FERRITIN, TIBC, IRON, RETICCTPCT in the last 72 hours. Urinalysis    Component Value Date/Time   COLORURINE STRAW (A) 09/23/2016 1826   APPEARANCEUR CLEAR 09/23/2016 1826   LABSPEC 1.004 (L) 09/23/2016 1826   PHURINE 7.0 09/23/2016 1826   GLUCOSEU NEGATIVE 09/23/2016 1826   HGBUR SMALL (A) 09/23/2016 1826   BILIRUBINUR NEGATIVE 09/23/2016 1826   KETONESUR NEGATIVE 09/23/2016 1826   PROTEINUR NEGATIVE 09/23/2016 1826   NITRITE NEGATIVE 09/23/2016 1826   LEUKOCYTESUR NEGATIVE 09/23/2016 1826   Sepsis Labs Invalid input(s): PROCALCITONIN,  WBC,  LACTICIDVEN Microbiology No results found for this or any previous visit (from the past 240 hour(s)).  Time coordinating discharge: Over 30 minutes  SIGNED:  Irwin Brakeman, MD  [facility] 09/24/2016, 2:43 PM Pager   If 7PM-7AM, please contact night-coverage [email or web address] Password [email or web address]

## 2016-09-24 NOTE — Progress Notes (Signed)
Pharmacy Antibiotic Note  Joy Cook is a 51 y.o. female admitted on 09/23/2016 with Intra-abdominal infection.  Pharmacy has been consulted for zosyn dosing.  Plan: Zosyn 3.375g IV q8h (4 hour infusion).     Temp (24hrs), Avg:98.8 F (37.1 C), Min:98.8 F (37.1 C), Max:98.8 F (37.1 C)   Recent Labs Lab 09/23/16 2012  WBC 12.4*  CREATININE 0.85    CrCl cannot be calculated (Unknown ideal weight.).    Allergies  Allergen Reactions  . Hydrocodone Other (See Comments)  . Shellfish Allergy Hives  . Codeine Hives    Fatigue, dizziness, and nausea  . Hydrocodone Nausea And Vomiting and Other (See Comments)    fatigue  . Shellfish Allergy Hives  . Tandem [Ferrous Fum-Iron Polysacch] Nausea And Vomiting    Antimicrobials this admission: Unasyn  09/23/2016 x1 Zosyn 09/24/2016 >>   Dose adjustments this admission: -  Microbiology results: pending  Thank you for allowing pharmacy to be a part of this patient's care.  Nani Skillern Crowford 09/24/2016 2:06 AM

## 2016-09-24 NOTE — H&P (Signed)
History and Physical    MADAI HERNANDEZGARCI [id number] DOB: [birth year] DOA: 09/23/2016  PCP: Gerrit Heck, MD   Patient coming from: Home  Chief Complaint: LLQ pain   HPI: JACQUELEEN WERMAN is a 51 y.o. female with medical history significant for persistent asthma, GERD, hypothyroidism, and chronic pain syndrome who presents to the emergency department with severe left lower quadrant pain and nausea. Patient presented to the emergency department with very similar complaints on 09/10/2016, was found to have acute diverticulitis involving the descending colon, and was discharged home with ciprofloxacin and Flagyl. She reports that the pain had improved significantly, but not resolved, before worsening yesterday and becoming unbearable today. She describes the pain as severe, sharp, occasional radiation to the mid back, worse with palpation or movement, and with no alleviating factors identified. Patient endorses some mild associated nausea, but denies vomiting or diarrhea. There has been no melena or hematochezia. She reports completing her antibiotics on the day of admission.she denies any flank pain or dysuria. She continues to pass flatus.  ED Course: Upon arrival to the ED, patient is found to be afebrile, saturating well on room air, and with vital signs stable. Chest x-ray is negative for acute cardiopulmonary disease and KUB reveals moderate colonic stool burden along the right colon with no obstruction. Chemistry panels notable for a hypokalemia to 3.2 and CBC features a leukocytosis to 12,400, improved from 09/10/2016 when she was diagnosed with acute diverticulitis. Urinalysis is notable for small hemoglobin and a low specific gravity. CT of the abdomen and pelvis was obtained and notable for persistent, but improving inflammatory changes around the descending colon, consistent with improving diverticulitis without abscess. Patient was given an empiric dose of Unasyn in the emergency  department and potassium was replaced with 40 mEq orally. She was given symptomatic care with IV Dilaudid, Toradol, Ativan, and Zofran. Despite these measures, she continued to complain of unbearable pain. She has remained hemodynamically stable and in no apparent respiratory distress and will be observed on the medical/surgical unit for ongoing evaluation and management.  Review of Systems:  All other systems reviewed and apart from HPI, are negative.  Past Medical History:  Diagnosis Date  . Asthma   . GERD (gastroesophageal reflux disease)   . Hypothyroidism   . Thyroid disease     Past Surgical History:  Procedure Laterality Date  . BREAST REDUCTION SURGERY    . BREAST SURGERY    . BREAST SURGERY  LUMP REMOVED   1988  . CESAREAN SECTION    . CESAREAN SECTION     X3  . COSMETIC SURGERY    . DILATATION & CURRETTAGE/HYSTEROSCOPY WITH RESECTOCOPE N/A 05/03/2014   Procedure: [place];  Surgeon: Thurnell Lose, MD;  Location: [facility];  Service: Gynecology;  Laterality: N/A;  . HERNIA REPAIR    . KNEE ARTHROSCOPY     LEFT AND RIGHT  . SHOULDER ARTHROSCOPY    . SHOULDER SURGERY Left   . tummy tuck  2010     reports that she has never smoked. She has never used smokeless tobacco. She reports that she does not drink alcohol or use drugs.  Allergies  Allergen Reactions  . Hydrocodone Other (See Comments)  . Shellfish Allergy Hives  . Codeine Hives    Fatigue, dizziness, and nausea  . Hydrocodone Nausea And Vomiting and Other (See Comments)    fatigue  . Shellfish Allergy Hives  . Tandem [Ferrous Fum-Iron Polysacch] Nausea And Vomiting  Family History  Problem Relation Age of Onset  . Hypertension Other      Prior to Admission medications   Medication Sig Start Date End Date Taking? Authorizing Provider  amLODipine (NORVASC) 5 MG tablet Take 1 tablet by mouth daily. 02/19/15  Yes Historical Provider, MD  cetirizine (ZYRTEC  ALLERGY) 10 MG tablet Take 10 mg by mouth daily as needed for allergies.   Yes Historical Provider, MD  cholecalciferol (VITAMIN D) 1000 units tablet Take 1,000 Units by mouth daily.   Yes Historical Provider, MD  diphenhydrAMINE (BENADRYL) 25 mg capsule Take 25-50 mg by mouth daily as needed for allergies.   Yes Historical Provider, MD  ferrous fumarate (HEMOCYTE - 106 MG FE) 325 (106 FE) MG TABS tablet Take 1 tablet by mouth.   Yes Historical Provider, MD  fluticasone (FLOVENT HFA) 110 MCG/ACT inhaler Inhale into the lungs 2 (two) times daily.   Yes Historical Provider, MD  gabapentin (NEURONTIN) 100 MG capsule Take 100 mg by mouth 3 (three) times daily.   Yes Historical Provider, MD  hydrochlorothiazide (HYDRODIURIL) 25 MG tablet Take 1 tablet by mouth daily. 08/18/16  Yes Historical Provider, MD  levothyroxine (SYNTHROID, LEVOTHROID) 100 MCG tablet Take 100 mcg by mouth daily before breakfast.   Yes Historical Provider, MD  meloxicam (MOBIC) 15 MG tablet Take 1 tablet by mouth daily. For 10 days monthly 05/06/16 05/06/17 Yes Historical Provider, MD  norethindrone-ethinyl estradiol-iron (ESTROSTEP FE,TILIA FE,TRI-LEGEST FE) 1-20/1-30/1-35 MG-MCG tablet Take 1 tablet by mouth daily.   Yes Historical Provider, MD  pantoprazole (PROTONIX) 20 MG tablet Take 1 tablet (20 mg total) by mouth daily. 07/29/13  Yes Evelina Bucy, MD  predniSONE (DELTASONE) 20 MG tablet Take 20 mg by mouth daily with breakfast.   Yes Historical Provider, MD    Physical Exam: Vitals:   09/23/16 1755 09/23/16 2117 09/24/16 0000  BP: 153/95 146/85 122/74  Pulse: 92 79 78  Resp: 18 15 16   Temp: 98.8 F (37.1 C)    TempSrc: Oral    SpO2: 97% 100% 93%      Constitutional: NAD, calm, comfortable Eyes: PERTLA, lids and conjunctivae normal ENMT: Mucous membranes are moist. Posterior pharynx clear of any exudate or lesions.   Neck: normal, supple, no masses, no thyromegaly Respiratory: clear to auscultation bilaterally, no  wheezing, no crackles. Normal respiratory effort.   Cardiovascular: S1 & S2 heard, regular rate and rhythm. No extremity edema. No significant JVD. Abdomen: Tender in LLQ without rebound pain or guarding. Soft, no distension, no masses palpated. Bowel sounds normal.  Musculoskeletal: no clubbing / cyanosis. No joint deformity upper and lower extremities. Normal muscle tone.  Skin: no significant rashes, lesions, ulcers. Warm, dry, well-perfused. Neurologic: CN 2-12 grossly intact. Sensation intact, DTR normal. Strength 5/5 in all 4 limbs.  Psychiatric: Somnolent, but oriented x 3. Calm and cooperative.     Labs on Admission: I have personally reviewed following labs and imaging studies  CBC:  Recent Labs Lab 09/23/16 2012  WBC 12.4*  HGB 13.3  HCT 39.4  MCV 80.2  PLT 99991111   Basic Metabolic Panel:  Recent Labs Lab 09/23/16 2012  NA 138  K 3.2*  CL 103  CO2 26  GLUCOSE 133*  BUN 9  CREATININE 0.85  CALCIUM 9.8   GFR: Estimated Creatinine Clearance: 74.7 mL/min (by C-G formula based on SCr of 0.85 mg/dL). Liver Function Tests:  Recent Labs Lab 09/23/16 2012  AST 19  ALT 16  ALKPHOS 56  BILITOT  0.5  PROT 8.7*  ALBUMIN 4.7    Recent Labs Lab 09/23/16 2012  LIPASE 19   No results for input(s): AMMONIA in the last 168 hours. Coagulation Profile: No results for input(s): INR, PROTIME in the last 168 hours. Cardiac Enzymes: No results for input(s): CKTOTAL, CKMB, CKMBINDEX, TROPONINI in the last 168 hours. BNP (last 3 results) No results for input(s): PROBNP in the last 8760 hours. HbA1C: No results for input(s): HGBA1C in the last 72 hours. CBG: No results for input(s): GLUCAP in the last 168 hours. Lipid Profile: No results for input(s): CHOL, HDL, LDLCALC, TRIG, CHOLHDL, LDLDIRECT in the last 72 hours. Thyroid Function Tests: No results for input(s): TSH, T4TOTAL, FREET4, T3FREE, THYROIDAB in the last 72 hours. Anemia Panel: No results for input(s):  VITAMINB12, FOLATE, FERRITIN, TIBC, IRON, RETICCTPCT in the last 72 hours. Urine analysis:    Component Value Date/Time   COLORURINE STRAW (A) 09/23/2016 1826   APPEARANCEUR CLEAR 09/23/2016 1826   LABSPEC 1.004 (L) 09/23/2016 1826   PHURINE 7.0 09/23/2016 1826   GLUCOSEU NEGATIVE 09/23/2016 1826   HGBUR SMALL (A) 09/23/2016 1826   BILIRUBINUR NEGATIVE 09/23/2016 1826   KETONESUR NEGATIVE 09/23/2016 1826   PROTEINUR NEGATIVE 09/23/2016 1826   NITRITE NEGATIVE 09/23/2016 1826   LEUKOCYTESUR NEGATIVE 09/23/2016 1826   Sepsis Labs: @LABRCNTIP (procalcitonin:4,lacticidven:4) )No results found for this or any previous visit (from the past 240 hour(s)).   Radiological Exams on Admission: Ct Abdomen Pelvis W Contrast  Result Date: 09/23/2016 CLINICAL DATA:  Diverticulitis 2 weeks ago. Left lower quadrant abdominal pain. Pain increased today. Just finished antibiotics. EXAM: CT ABDOMEN AND PELVIS WITH CONTRAST TECHNIQUE: Multidetector CT imaging of the abdomen and pelvis was performed using the standard protocol following bolus administration of intravenous contrast. CONTRAST:  116mL ISOVUE-300 IOPAMIDOL (ISOVUE-300) INJECTION 61% COMPARISON:  09/10/2016 FINDINGS: Lower chest: Lung bases are clear. Hepatobiliary: Benign-appearing cyst in segment 2 of the liver, measuring 1.9 cm diameter. No change since prior study. Subcentimeter cyst in the segment 3. Gallbladder and bile ducts are unremarkable. Pancreas: Unremarkable. No pancreatic ductal dilatation or surrounding inflammatory changes. Spleen: Normal in size without focal abnormality. Adrenals/Urinary Tract: Adrenal glands are unremarkable. Kidneys are normal, without renal calculi, focal lesion, or hydronephrosis. Bladder is unremarkable. Stomach/Bowel: Stomach and small bowel are unremarkable. Scattered gas and stool throughout the colon. No colonic distention. There is infiltration in the pericolonic fat around the mid and lower descending colon  consistent with diverticulitis. Inflammatory reaction is decreased since previous study. No abscess. Appendix is normal. Vascular/Lymphatic: No significant vascular findings are present. No enlarged abdominal or pelvic lymph nodes. Reproductive: Uterus and bilateral adnexa are unremarkable. Other: No free air or free fluid in the abdomen or pelvis. Scarring in the anterior abdominal wall is likely postoperative. Musculoskeletal: No acute or significant osseous findings. IMPRESSION: Persistent but decreasing inflammatory changes around the descending colon consistent with improvement of diverticulitis. No abscess. Electronically Signed   By: Lucienne Capers M.D.   On: 09/23/2016 22:04   Dg Abd Acute W/chest  Result Date: 09/23/2016 CLINICAL DATA:  Left-sided gastric and back pain for 2 weeks. History of diverticulitis diagnosed 2 weeks ago. Chills. EXAM: DG ABDOMEN ACUTE W/ 1V CHEST COMPARISON:  CXR 09/10/2016, CT 09/05/2016 FINDINGS: Heart and mediastinal contours within normal limits. Lungs are clear. Moderate colonic stool burden of along the right colon. No small bowel obstruction. Enteric contrast is seen within the stomach and proximal small bowel. Contrast filled diverticulum noted the descending colon from prior  CT. There is no free air. No acute osseous abnormality. No suspicious calculi. IMPRESSION: Moderate colonic stool burden along the right colon. No bowel obstruction nor suspicious calculi. No acute cardiopulmonary disease. Electronically Signed   By: Ashley Royalty M.D.   On: 09/23/2016 20:58    EKG: Not performed, will obtain as appropriate.   Assessment/Plan  1. Diverticulitis with intractable LLQ pain  - Pt presents with re-worsening in her LLQ pain which had been improving with Cipro and Flagyl for diverticulitis  - Pain is same in character and location, just more severe  - CT repeated on admission demonstrates interval improvement in diverticulitis; WBC has also improved  - Abd is  soft without peritoneal signs  - She was treated with IV Dilaudid and IV Toradol in the ED; she is now somnolent, but continues to complain of severe pain - Antibiotics completed at home on day of admit, will likely need to extend the course; plan to treat with Zosyn monotherapy while in hospital  - Pain-management complicated by chronic pain syndrome, followed by pain-management and discussed below  - Tramadol for moderate pain, Toradol for severe pain, avoiding narcotic    2. Chronic pain syndrome  - Chronic pain attributed to bilateral hands and forearms with unclear diagnosis; has been evaluated by neurology with normal conduction studies; evaluated by hand surgery, but told no surgical treatment options  - Follows with pain management, and per notes is considered high-risk d/t recent refusal of UDS  - She is managed with Mobic and Neurontin; will continue Neurontin, hold Mobic while on Toradol - Avoiding narcotics as above  3. Hypokalemia  - Potassium 3.2 on admission  - Possibly secondary to HCTZ, will hold - 40 mEq oral potassium given in ED and KCl added to IVF; magnesium given empirically  - Repeat chemistries in am    4. GERD - No EGD report on file  - Managed with daily Protonix at home, will continue    5. Hypothyroidism  - Appears stable, continue Synthroid   6. Asthma  - Moderate-persistent; stable on admission  - Continue scheduled Flovent, prn albuterol    DVT prophylaxis: sq Lovenox  Code Status: Full  Family Communication: Husband updated at bedside Disposition Plan: Observe on med-surg Consults called: None Admission status: Observation    Vianne Bulls, MD [facility] Pager [phone]  If 7PM-7AM, please contact night-coverage [email or web address] Password [email or web address]  09/24/2016, 1:04 AM

## 2016-09-24 NOTE — Progress Notes (Signed)
Patient understands DC instructions.IV removed. Printed prescription provided. Taken to car via wheelchair.

## 2016-10-16 ENCOUNTER — Ambulatory Visit (INDEPENDENT_AMBULATORY_CARE_PROVIDER_SITE_OTHER): Payer: Federal, State, Local not specified - PPO | Admitting: Cardiology

## 2016-10-16 DIAGNOSIS — R079 Chest pain, unspecified: Secondary | ICD-10-CM | POA: Insufficient documentation

## 2016-10-16 DIAGNOSIS — R42 Dizziness and giddiness: Secondary | ICD-10-CM

## 2016-10-16 DIAGNOSIS — R072 Precordial pain: Secondary | ICD-10-CM

## 2016-10-16 DIAGNOSIS — R002 Palpitations: Secondary | ICD-10-CM | POA: Diagnosis not present

## 2016-10-16 NOTE — Patient Instructions (Signed)
Medication Instructions:   Your physician recommends that you continue on your current medications as directed. Please refer to the Current Medication list given to you today.    Testing/Procedures:  Your physician has recommended that you wear a 24 HOUR holter monitor. Holter monitors are medical devices that record the heart's electrical activity. Doctors most often use these monitors to diagnose arrhythmias. Arrhythmias are problems with the speed or rhythm of the heartbeat. The monitor is a small, portable device. You can wear one while you do your normal daily activities. This is usually used to diagnose what is causing palpitations/syncope (passing out).   Your physician has requested that you have a lexiscan myoview. For further information please visit [email or web address]. Please follow instruction sheet, as given.     Follow-Up:  2 MONTHS WITH DR Meda Coffee       If you need a refill on your cardiac medications before your next appointment, please call your pharmacy.

## 2016-10-16 NOTE — Progress Notes (Signed)
Cardiology Office Note    Date:  10/19/2016   ID:  FRANKIE ZITO, DOB 09-01-1965, MRN [id number]  PCP and referring physician:  Gerrit Heck, MD  Cardiologist: Ena Dawley, MD    Chief complain: chest pain, palpitations  History of Present Illness:  Joy Cook is a 51 y.o. female with h/o asthma, GERD who is coming with complains of chest pains, she has tried [place] with no relief. She states that her pain is across her chest. She also has shoulder pains and is unable to distinguish those from chest pain. She is limited with walking sec to knee pains.  She has been experiencing palpitations, isolated strong betas that are very scary for her but also at nigh, fast and strong heart beats, there is associated dizziness but no syncope. She has never smoked and has no family h/o premature CAD. Denies LE edema, orthopnea or PND.   Past Medical History:  Diagnosis Date  . Asthma   . GERD (gastroesophageal reflux disease)   . Hypothyroidism   . Thyroid disease     Past Surgical History:  Procedure Laterality Date  . BREAST REDUCTION SURGERY    . BREAST SURGERY    . BREAST SURGERY  LUMP REMOVED   1988  . CESAREAN SECTION    . CESAREAN SECTION     X3  . COSMETIC SURGERY    . DILATATION & CURRETTAGE/HYSTEROSCOPY WITH RESECTOCOPE N/A 05/03/2014   Procedure: [place];  Surgeon: Thurnell Lose, MD;  Location: [facility];  Service: Gynecology;  Laterality: N/A;  . HERNIA REPAIR    . KNEE ARTHROSCOPY     LEFT AND RIGHT  . SHOULDER ARTHROSCOPY    . SHOULDER SURGERY Left   . tummy tuck  2010    Current Medications: Outpatient Medications Prior to Visit  Medication Sig Dispense Refill  . amLODipine (NORVASC) 5 MG tablet Take 1 tablet by mouth daily.    . cetirizine (ZYRTEC ALLERGY) 10 MG tablet Take 10 mg by mouth daily as needed for allergies.    . cholecalciferol (VITAMIN D) 1000 units tablet Take 2,000 Units by mouth  daily.     . diphenhydrAMINE (BENADRYL) 25 mg capsule Take 25-50 mg by mouth daily as needed for allergies.    . fluticasone (FLONASE) 50 MCG/ACT nasal spray Place 1 spray into both nostrils 2 (two) times daily.    . fluticasone (FLOVENT HFA) 110 MCG/ACT inhaler Inhale 1 puff into the lungs 2 (two) times daily.     Marland Kitchen gabapentin (NEURONTIN) 600 MG tablet Take 1,200 mg by mouth 3 (three) times daily.    . hydrochlorothiazide (HYDRODIURIL) 25 MG tablet Take 1 tablet by mouth daily.    Marland Kitchen levothyroxine (SYNTHROID, LEVOTHROID) 100 MCG tablet Take 100 mcg by mouth daily before breakfast.    . medroxyPROGESTERone (PROVERA) 10 MG tablet Take 10 mg by mouth See admin instructions. Takes only 10 days a month, starts on the 1st and ends on the 10 th    . Ferrous Sulfate 27 MG TABS Take 1 tablet by mouth daily.    . ondansetron (ZOFRAN) 4 MG tablet Take 1 tablet (4 mg total) by mouth every 6 (six) hours as needed for nausea. (Patient not taking: Reported on 10/16/2016) 20 tablet 0  . pantoprazole (PROTONIX) 40 MG tablet Take 1 tablet (40 mg total) by mouth 2 (two) times daily before a meal. (Patient not taking: Reported on 10/16/2016) 60 tablet 0  . polyethylene glycol (MIRALAX /  GLYCOLAX) packet Take 17 g by mouth daily. (Patient not taking: Reported on 10/16/2016) 30 each 0   No facility-administered medications prior to visit.      Allergies:   Shellfish allergy; Tandem plus [fefum-fepo-fa-b [id number]]; and Tandem [ferrous fum-iron polysacch]   Social History   Social History  . Marital status: Married    Spouse name: N/A  . Number of children: N/A  . Years of education: N/A   Social History Main Topics  . Smoking status: Never Smoker  . Smokeless tobacco: Never Used  . Alcohol use No  . Drug use: No  . Sexual activity: Not on file   Other Topics Concern  . Not on file   Social History Narrative   ** Merged History Encounter **         Family History:  The patient's family history  includes Hypertension in her other.   ROS:   Please see the history of present illness.    ROS All other systems reviewed and are negative.   PHYSICAL EXAM:   VS:  BP 132/74   Pulse 72   Ht 5\' 2"  (1.575 m)   Wt 172 lb (78 kg)   LMP  (LMP Unknown) Comment: tubes tied  BMI 31.46 kg/m    GEN: Well nourished, well developed, in no acute distress  HEENT: normal  Neck: no JVD, carotid bruits, or masses Cardiac: RRR; no murmurs, rubs, or gallops,no edema  Respiratory:  clear to auscultation bilaterally, normal work of breathing GI: soft, nontender, nondistended, + BS MS: no deformity or atrophy  Skin: warm and dry, no rash Neuro:  Alert and Oriented x 3, Strength and sensation are intact Psych: euthymic mood, full affect  Wt Readings from Last 3 Encounters:  10/16/16 172 lb (78 kg)  09/24/16 170 lb 13.7 oz (77.5 kg)  09/10/16 171 lb (77.6 kg)      Studies/Labs Reviewed:   EKG:  EKG is ordered today.  The ekg ordered today demonstrates NSR and noral ECG.  Recent Labs: 09/23/2016: ALT 16; Hemoglobin 13.3; Platelets 397 09/24/2016: BUN 9; Creatinine, Ser 0.82; Magnesium 2.6; Potassium 3.9; Sodium 140   Lipid Panel No results found for: CHOL, TRIG, HDL, CHOLHDL, VLDL, LDLCALC, LDLDIRECT  Additional studies/ records that were reviewed today include:     ASSESSMENT:    1. Palpitations   2. Dizziness   3. Precordial pain      PLAN:  In order of problems listed above:  1. We will order a Lexiscan nuclear stress test to evaluate for ischemia and LVEF. 2. We will order a 24 hour Holter monitor to evaluate the palpitations.    Medication Adjustments/Labs and Tests Ordered: Current medicines are reviewed at length with the patient today.  Concerns regarding medicines are outlined above.  Medication changes, Labs and Tests ordered today are listed in the Patient Instructions below. Patient Instructions  Medication Instructions:   Your physician recommends that you  continue on your current medications as directed. Please refer to the Current Medication list given to you today.    Testing/Procedures:  Your physician has recommended that you wear a 24 HOUR holter monitor. Holter monitors are medical devices that record the heart's electrical activity. Doctors most often use these monitors to diagnose arrhythmias. Arrhythmias are problems with the speed or rhythm of the heartbeat. The monitor is a small, portable device. You can wear one while you do your normal daily activities. This is usually used to diagnose what is causing palpitations/syncope (  passing out).   Your physician has requested that you have a lexiscan myoview. For further information please visit HugeFiesta.[email or web address]. Please follow instruction sheet, as given.     Follow-Up:  2 MONTHS WITH DR Meda Coffee       If you need a refill on your cardiac medications before your next appointment, please call your pharmacy.      Signed, Ena Dawley, MD  10/19/2016 8:15 AM    [facility] [address], [place], [place]  [address] Phone: [phone]; Fax: [phone]

## 2016-10-22 ENCOUNTER — Telehealth (HOSPITAL_COMMUNITY): Payer: Self-pay | Admitting: *Deleted

## 2016-10-22 NOTE — Telephone Encounter (Signed)
Patient given detailed instructions per Myocardial Perfusion Study Information Sheet for the test on 10/24/16 at 1100. Patient notified to arrive 15 minutes early and that it is imperative to arrive on time for appointment to keep from having the test rescheduled.  If you need to cancel or reschedule your appointment, please call the office within 24 hours of your appointment. Failure to do so may result in a cancellation of your appointment, and a $50 no show fee. Patient verbalized understanding.Khole Arterburn, Ranae Palms

## 2016-10-22 NOTE — Telephone Encounter (Signed)
Left message on voicemail in reference to upcoming appointment scheduled for 10/24/16 Phone number given for a call back so details instructions can be given.  Kirstie Peri

## 2016-10-24 ENCOUNTER — Ambulatory Visit (HOSPITAL_COMMUNITY): Payer: Federal, State, Local not specified - PPO | Attending: Cardiology

## 2016-10-24 ENCOUNTER — Ambulatory Visit (INDEPENDENT_AMBULATORY_CARE_PROVIDER_SITE_OTHER): Payer: Federal, State, Local not specified - PPO

## 2016-10-24 DIAGNOSIS — R002 Palpitations: Secondary | ICD-10-CM | POA: Diagnosis not present

## 2016-10-24 DIAGNOSIS — R42 Dizziness and giddiness: Secondary | ICD-10-CM | POA: Diagnosis not present

## 2016-10-24 DIAGNOSIS — R072 Precordial pain: Secondary | ICD-10-CM | POA: Diagnosis not present

## 2016-10-24 LAB — MYOCARDIAL PERFUSION IMAGING
LV dias vol: 73 mL (ref 46–106)
LV sys vol: 32 mL
Peak HR: 114 {beats}/min
RATE: 0.24
Rest HR: 82 {beats}/min
SDS: 0
SRS: 4
SSS: 4
TID: 1.02

## 2016-10-24 MED ORDER — REGADENOSON 0.4 MG/5ML IV SOLN
0.4000 mg | Freq: Once | INTRAVENOUS | Status: AC
  Administered 2016-10-24: 0.4 mg via INTRAVENOUS

## 2016-10-24 MED ORDER — TECHNETIUM TC 99M TETROFOSMIN IV KIT
32.6000 | PACK | Freq: Once | INTRAVENOUS | Status: AC | PRN
  Administered 2016-10-24: 32.6 via INTRAVENOUS
  Filled 2016-10-24: qty 33

## 2016-10-24 MED ORDER — TECHNETIUM TC 99M TETROFOSMIN IV KIT
10.4000 | PACK | Freq: Once | INTRAVENOUS | Status: AC | PRN
  Administered 2016-10-24: 10.4 via INTRAVENOUS
  Filled 2016-10-24: qty 11

## 2016-12-23 ENCOUNTER — Ambulatory Visit: Payer: Self-pay | Admitting: Cardiology

## 2017-01-19 ENCOUNTER — Other Ambulatory Visit: Payer: Self-pay | Admitting: Obstetrics and Gynecology

## 2017-01-19 DIAGNOSIS — N63 Unspecified lump in unspecified breast: Secondary | ICD-10-CM

## 2017-02-24 ENCOUNTER — Ambulatory Visit
Admission: RE | Admit: 2017-02-24 | Discharge: 2017-02-24 | Disposition: A | Discharge status: Home / Self Care | Payer: Federal, State, Local not specified - PPO | Source: Ambulatory Visit | Attending: Obstetrics and Gynecology | Admitting: Obstetrics and Gynecology

## 2017-02-24 DIAGNOSIS — N63 Unspecified lump in unspecified breast: Secondary | ICD-10-CM

## 2017-07-08 ENCOUNTER — Emergency Department (HOSPITAL_COMMUNITY)
Admission: EM | Admit: 2017-07-08 | Discharge: 2017-07-08 | Disposition: A | Discharge status: Home / Self Care | Payer: Federal, State, Local not specified - PPO | Attending: Emergency Medicine | Admitting: Emergency Medicine

## 2017-07-08 ENCOUNTER — Encounter (HOSPITAL_COMMUNITY): Payer: Self-pay | Admitting: Behavioral Health

## 2017-07-08 DIAGNOSIS — J45909 Unspecified asthma, uncomplicated: Secondary | ICD-10-CM | POA: Insufficient documentation

## 2017-07-08 DIAGNOSIS — Z79899 Other long term (current) drug therapy: Secondary | ICD-10-CM | POA: Insufficient documentation

## 2017-07-08 DIAGNOSIS — Z791 Long term (current) use of non-steroidal anti-inflammatories (NSAID): Secondary | ICD-10-CM | POA: Diagnosis not present

## 2017-07-08 DIAGNOSIS — F322 Major depressive disorder, single episode, severe without psychotic features: Secondary | ICD-10-CM | POA: Diagnosis not present

## 2017-07-08 DIAGNOSIS — G8929 Other chronic pain: Secondary | ICD-10-CM | POA: Insufficient documentation

## 2017-07-08 DIAGNOSIS — E039 Hypothyroidism, unspecified: Secondary | ICD-10-CM | POA: Diagnosis not present

## 2017-07-08 DIAGNOSIS — Z1339 Encounter for screening examination for other mental health and behavioral disorders: Secondary | ICD-10-CM | POA: Diagnosis present

## 2017-07-08 DIAGNOSIS — F332 Major depressive disorder, recurrent severe without psychotic features: Secondary | ICD-10-CM

## 2017-07-08 LAB — RAPID URINE DRUG SCREEN, HOSP PERFORMED
AMPHETAMINES: NOT DETECTED
BARBITURATES: NOT DETECTED
BENZODIAZEPINES: NOT DETECTED
Cocaine: NOT DETECTED
Opiates: NOT DETECTED
TETRAHYDROCANNABINOL: NOT DETECTED

## 2017-07-08 LAB — CBC
HEMATOCRIT: 39.4 % (ref 36.0–46.0)
HEMOGLOBIN: 13.2 g/dL (ref 12.0–15.0)
MCH: 27.4 pg (ref 26.0–34.0)
MCHC: 33.5 g/dL (ref 30.0–36.0)
MCV: 81.9 fL (ref 78.0–100.0)
Platelets: 280 10*3/uL (ref 150–400)
RBC: 4.81 MIL/uL (ref 3.87–5.11)
RDW: 12.5 % (ref 11.5–15.5)
WBC: 8.5 10*3/uL (ref 4.0–10.5)

## 2017-07-08 LAB — I-STAT BETA HCG BLOOD, ED (MC, WL, AP ONLY)

## 2017-07-08 LAB — COMPREHENSIVE METABOLIC PANEL
ALBUMIN: 4.2 g/dL (ref 3.5–5.0)
ALK PHOS: 83 U/L (ref 38–126)
ALT: 15 U/L (ref 14–54)
ANION GAP: 8 (ref 5–15)
AST: 20 U/L (ref 15–41)
BILIRUBIN TOTAL: 0.7 mg/dL (ref 0.3–1.2)
BUN: 8 mg/dL (ref 6–20)
CALCIUM: 9.6 mg/dL (ref 8.9–10.3)
CO2: 29 mmol/L (ref 22–32)
CREATININE: 0.83 mg/dL (ref 0.44–1.00)
Chloride: 100 mmol/L — ABNORMAL LOW (ref 101–111)
GFR calc Af Amer: >60 mL/min (ref >60)
GFR calc non Af Amer: >60 mL/min (ref >60)
GLUCOSE: 119 mg/dL — AB (ref 65–99)
Potassium: 3.4 mmol/L — ABNORMAL LOW (ref 3.5–5.1)
Sodium: 137 mmol/L (ref 135–145)
TOTAL PROTEIN: 7.8 g/dL (ref 6.5–8.1)

## 2017-07-08 LAB — ACETAMINOPHEN LEVEL: Acetaminophen (Tylenol), Serum: <10 ug/mL — ABNORMAL LOW (ref 10–30)

## 2017-07-08 LAB — ETHANOL: Alcohol, Ethyl (B): <10 mg/dL (ref <10)

## 2017-07-08 LAB — SALICYLATE LEVEL: Salicylate Lvl: <7 mg/dL (ref 2.8–30.0)

## 2017-07-08 MED ORDER — HYDROCHLOROTHIAZIDE 25 MG PO TABS: 25.0000 mg | ORAL_TABLET | Freq: Every day | ORAL | Status: DC

## 2017-07-08 MED ORDER — FLUTICASONE PROPIONATE HFA 110 MCG/ACT IN AERO: 1.0000 | INHALATION_SPRAY | Freq: Two times a day (BID) | RESPIRATORY_TRACT | Status: DC

## 2017-07-08 MED ORDER — AMLODIPINE BESYLATE 5 MG PO TABS: 5.0000 mg | ORAL_TABLET | Freq: Every day | ORAL | Status: DC

## 2017-07-08 MED ORDER — MEDROXYPROGESTERONE ACETATE 10 MG PO TABS: 10.0000 mg | ORAL_TABLET | ORAL | Status: DC

## 2017-07-08 MED ORDER — ARIPIPRAZOLE 5 MG PO TABS: 15.0000 mg | ORAL_TABLET | Freq: Every day | ORAL | Status: DC

## 2017-07-08 MED ORDER — TRAMADOL HCL 50 MG PO TABS: 50.0000 mg | ORAL_TABLET | Freq: Four times a day (QID) | ORAL | Status: DC | PRN

## 2017-07-08 MED ORDER — LORATADINE 10 MG PO TABS: 10.0000 mg | ORAL_TABLET | Freq: Every day | ORAL | Status: DC

## 2017-07-08 MED ORDER — MELOXICAM 15 MG PO TABS: 15.0000 mg | ORAL_TABLET | Freq: Every day | ORAL | Status: DC

## 2017-07-08 MED ORDER — FLUTICASONE PROPIONATE 50 MCG/ACT NA SUSP: 1.0000 | Freq: Two times a day (BID) | NASAL | Status: DC

## 2017-07-08 MED ORDER — LEVOTHYROXINE SODIUM 100 MCG PO TABS: 100.0000 ug | ORAL_TABLET | Freq: Every day | ORAL | Status: DC

## 2017-07-08 MED ORDER — GABAPENTIN 600 MG PO TABS: 1200.0000 mg | ORAL_TABLET | Freq: Three times a day (TID) | ORAL | Status: DC

## 2017-07-08 MED ORDER — VITAMIN D3 25 MCG (1000 UNIT) PO TABS
2000.0000 [IU] | ORAL_TABLET | Freq: Every day | ORAL | Status: DC
Start: 2017-07-08 — End: 2017-07-08

## 2017-07-08 MED ORDER — ALBUTEROL SULFATE (5 MG/ML) 0.5% IN NEBU: 2.5000 mg | INHALATION_SOLUTION | RESPIRATORY_TRACT | Status: DC

## 2017-07-08 MED ORDER — FERROUS SULFATE 27 MG PO TABS: 1.0000 | ORAL_TABLET | Freq: Every day | ORAL | Status: DC

## 2017-07-08 NOTE — ED Notes (Signed)
TTS at beside speaking with patient.

## 2017-07-08 NOTE — ED Triage Notes (Signed)
Brought in by husband. Husband states that patient became very upset on the way to bible study (30 min prior to arrival) and began to punch the GPS system in the car. Husband called psychiatrist and was told to bring patient to ED. Patient made remarks to husband stating "I want to kill myself." When asked during assessment, patient denies SI/ or HI and states that she was just upset at the time. Cooperative and calm upon arrival.

## 2017-07-08 NOTE — Progress Notes (Signed)
Patient ID: Joy Cook, female   DOB: 05-10-1966, 51 y.o.   MRN: [id number]  Pt was seen and case discussed with treatment team and Dr Dwyane Dee. Pt is able to contract for safety upon discharge. Pt was upset, suicidal ideation was situational. Pt is seen by a psychiatrist, Debbora Dus at [facility], as an outpatient and will follow up with her when she gets home. Recommendation is to discharge home.   Ethelene Hal, NP-C 07/08/2017     [id number]

## 2017-07-08 NOTE — ED Notes (Signed)
ED Provider at bedside. 

## 2017-07-08 NOTE — Discharge Instructions (Signed)
For your behavioral health needs, you are advised to continue treatment with [facility]:       [facility]      [address] [place], [place] [address]      [phone]

## 2017-07-08 NOTE — BH Assessment (Signed)
[facility] Assessment Progress Note  Per Ethelene Hal, FNP, this pt does not require psychiatric hospitalization at this time.  Pt is to be discharged from [facility] with recommendation to continue treatment with [facility].  This has been included in pt's discharge instructions.  Pt's nurse has been notified.  Jalene Mullet, [place] Triage Specialist [phone]

## 2017-07-08 NOTE — ED Notes (Signed)
Bed: CQ19 Expected date:  Expected time:  Means of arrival:  Comments: Triage-28min

## 2017-07-08 NOTE — BH Assessment (Signed)
Assessment Note  Joy Cook is a 51 y.o. female who presented to [facility] on a voluntary basis with complaint of depressive symptoms.  Pt is accompanied by her husband.  Pt provided history.    Per report, Pt became frustrated while driving in a car with her husband and started hitting the dashboard and stating, "I want to go home and die ... I want to kill myself."  Pt's husband brought her to the ED.  Pt reported that she is treated for depression and anxiety by [facility].  Pt reported that depressive symptoms have been exacerbated by significant psychosocial stressors -- the death of her mother, the death of her mother-in-law, and chronic pain caused by a bulging disk.  Pt reported that she has felt even more depressed for about a week due to conflict with her brother.  Pt admitted that she made a suicidal statement today, but denied current suicidal ideation or past suicide attempts. Pt endorsed the following symptoms:  Persistent and unremitting despondency; insomnia; tearfulness; irritability; agitation/restlessness; fatigue; worthlessness and hopelessness.  Pt denied SI, homicidal ideation, and auditory/visual hallucinations.  Pt stated that she receives outpatient services through [facility] and that she is compliant with medication (Abilify, Klonopin).  She lives with her husband and does not work due to chronic pain.  She is seeking disability.  During assessment, Pt presented as alert and oriented.  She had good eye contact and was cooperative in session.  Pt was in scrubs and appeared appropriately groomed.  Pt's mood and affect were depressed.  Pt denied SI.  She endorsed other depressive symptoms (see above).  Pt's speech was normal in rate, rhythm, and volume.  Pt's thought processes were within normal range, and thought content was logical and goal-oriented.  There was no evidence of delusion.  Pt's memory and concentration were intact.  Insight, judgment, and impulse  control were deemed fair.  Consulted with Starleen Arms, NP, who recommended discharge to outpatient provider.  The patient agrees with this.  She and husband stated that they can plan for safety.  Diagnosis: F32.2; MDD, Single Ep., Severe w/o psychotic features  Past Medical History:  Past Medical History:  Diagnosis Date  . Asthma   . GERD (gastroesophageal reflux disease)   . Hypothyroidism   . Thyroid disease     Past Surgical History:  Procedure Laterality Date  . BREAST REDUCTION SURGERY    . BREAST SURGERY    . BREAST SURGERY  LUMP REMOVED   1988  . CESAREAN SECTION    . CESAREAN SECTION     X3  . COSMETIC SURGERY    . DILATATION & CURRETTAGE/HYSTEROSCOPY WITH RESECTOCOPE N/A 05/03/2014   Procedure: [place];  Surgeon: Thurnell Lose, MD;  Location: [facility];  Service: Gynecology;  Laterality: N/A;  . HERNIA REPAIR    . KNEE ARTHROSCOPY     LEFT AND RIGHT  . REDUCTION MAMMAPLASTY Bilateral    1998  . SHOULDER ARTHROSCOPY    . SHOULDER SURGERY Left   . tummy tuck  2010    Family History:  Family History  Problem Relation Age of Onset  . Hypertension Other     Social History:  reports that  has never smoked. she has never used smokeless tobacco. She reports that she does not drink alcohol or use drugs.  Additional Social History:  Alcohol / Drug Use Pain Medications: See MAR Prescriptions: See MAR Over the Counter: See MAR History of  alcohol / drug use?: No history of alcohol / drug abuse  CIWA: CIWA-Ar BP: (!) 169/82 Pulse Rate: 88 COWS:    Allergies:  Allergies  Allergen Reactions  . Shellfish Allergy Anaphylaxis, Hives and Itching  . Tandem Plus [Fefum-Fepo-Fa-B Cmp-C-Zn-Mn-Cu] Nausea And Vomiting and Other (See Comments)    Dizzy and fatigue   . Tandem [Ferrous Fum-Iron Polysacch] Nausea And Vomiting and Other (See Comments)    Dizzy and fatigue      Home Medications:  (Not in a hospital  admission)  OB/GYN Status:  No LMP recorded.  General Assessment Data TTS Assessment: In system Is this a Tele or Face-to-Face Assessment?: Face-to-Face Is this an Initial Assessment or a Re-assessment for this encounter?: Initial Assessment Marital status: Married Is patient pregnant?: No Pregnancy Status: No Living Arrangements: Spouse/significant other Can pt return to current living arrangement?: Yes Admission Status: Voluntary Is patient capable of signing voluntary admission?: Yes Referral Source: Self/Family/Friend     Crisis Care Plan Living Arrangements: Spouse/significant other Name of Psychiatrist: [place] Name of Therapist: [employer]  Education Status Is patient currently in school?: No  Risk to self with the past 6 months Suicidal Ideation: No-Not Currently/Within Last 6 Months Has patient been a risk to self within the past 6 months prior to admission? : Yes Suicidal Intent: No Has patient had any suicidal intent within the past 6 months prior to admission? : No Is patient at risk for suicide?: No Suicidal Plan?: No Access to Means: No What has been your use of drugs/alcohol within the last 12 months?: (Pt denied) Other Self Harm Risks: (None) Intentional Self Injurious Behavior: None Recent stressful life event(s): Loss (Comment), Recent negative physical changes Persecutory voices/beliefs?: No(pain due to bulging disk; death of mother and mother-in-law) Depression Symptoms: Despondent, Tearfulness, Isolating, Fatigue, Guilt, Feeling worthless/self pity Substance abuse history and/or treatment for substance abuse?: No Suicide prevention information given to non-admitted patients: Not applicable  Risk to Others within the past 6 months Homicidal Ideation: No Thoughts of Harm to Others: No Current Homicidal Plan: No Assessment of Violence: None Noted Criminal Charges Pending?: No  Psychosis Hallucinations: None  noted Delusions: None noted  Mental Status Report Appearance/Hygiene: Unremarkable, In scrubs Eye Contact: Good Motor Activity: Unremarkable Level of Consciousness: Alert Mood: Depressed Affect: Appropriate to circumstance Anxiety Level: Moderate Thought Processes: Coherent, Relevant Judgement: Partial Orientation: Person, Place, Time, Situation Obsessive Compulsive Thoughts/Behaviors: None  Cognitive Functioning Concentration: Good Memory: Recent Intact, Remote Intact IQ: Average Insight: Fair Impulse Control: Fair Appetite: Good Sleep: Decreased Total Hours of Sleep: (4 hrs per night) Vegetative Symptoms: None  ADLScreening [facility] Assessment Services) Patient's cognitive ability adequate to safely complete daily activities?: Yes Patient able to express need for assistance with ADLs?: Yes Independently performs ADLs?: Yes (appropriate for developmental age)  Prior Inpatient Therapy Prior Inpatient Therapy: No  Prior Outpatient Therapy Prior Outpatient Therapy: Yes Prior Therapy Dates: (ongoing) [facility] Reason for Treatment: (Depression, anxiety) Does patient have an ACCT team?: No Does patient have Intensive In-House Services?  : No Does patient have [facility] services? : No Does patient have P4CC services?: No  ADL Screening (condition at time of admission) Patient's cognitive ability adequate to safely complete daily activities?: Yes Is the patient deaf or have difficulty hearing?: No Does the patient have difficulty seeing, even when wearing glasses/contacts?: No Does the patient have difficulty concentrating, remembering, or making decisions?: No Patient able to express need for assistance with ADLs?:  Yes Does the patient have difficulty dressing or bathing?: No Independently performs ADLs?: Yes (appropriate for developmental age) Does the patient have difficulty walking or climbing stairs?: No Weakness of Legs:  None Weakness of Arms/Hands: None  Home Assistive Devices/Equipment Home Assistive Devices/Equipment: None  Therapy Consults (therapy consults require a physician order) PT Evaluation Needed: No OT Evalulation Needed: No SLP Evaluation Needed: No Abuse/Neglect Assessment (Assessment to be complete while patient is alone) Abuse/Neglect Assessment Can Be Completed: Yes Physical Abuse: Denies Verbal Abuse: Denies Sexual Abuse: Denies Exploitation of patient/patient's resources: Denies Self-Neglect: Denies Values / Beliefs Cultural Requests During Hospitalization: None Spiritual Requests During Hospitalization: None Consults Spiritual Care Consult Needed: No Social Work Consult Needed: No Regulatory affairs officer (For Healthcare) Does Patient Have a Medical Advance Directive?: No    Additional Information 1:1 In Past 12 Months?: No CIRT Risk: No Elopement Risk: No Does patient have medical clearance?: No     Disposition:  Disposition Initial Assessment Completed for this Encounter: Yes Disposition of Patient: Referred to Patient referred to: Other (Comment)(Current o/p provider -- [facility])  On Site Evaluation by:   Reviewed with Physician:    Laurena Slimmer Libra Gatz 07/08/2017 2:34 PM

## 2017-07-08 NOTE — ED Provider Notes (Signed)
[facility] DEPT Provider Note   CSN: [id number] Arrival date & time: 07/08/17  1211     History   Chief Complaint Chief Complaint  Patient presents with  . Psychiatric Evaluation    HPI Joy Cook is a 51 y.o. female.  51 year old female presents with suicidal ideations.  Patient is unsure of her plan and denies any intentional ingestions.  Does have a history of schizoaffective disorder and has chronic auditory hallucinations and states that the voices told her to harm herself today.  She was in a car with her husband when she became upset and started to bang her fist against the dashboard.  She denies any homicidal ideations.  States that she has been compliant with her medications.  Went to see her therapist today who sent her here for further management      Past Medical History:  Diagnosis Date  . Asthma   . GERD (gastroesophageal reflux disease)   . Hypothyroidism   . Thyroid disease     Patient Active Problem List   Diagnosis Date Noted  . Palpitations 10/16/2016  . Dizziness 10/16/2016  . Chest pain 10/16/2016  . Diverticulitis large intestine 09/24/2016  . Intractable abdominal pain 09/24/2016  . Chronic pain syndrome 09/24/2016  . GERD (gastroesophageal reflux disease) 09/24/2016  . Asthma 09/24/2016  . Hypothyroidism 09/24/2016  . Hypokalemia 09/24/2016    Past Surgical History:  Procedure Laterality Date  . BREAST REDUCTION SURGERY    . BREAST SURGERY    . BREAST SURGERY  LUMP REMOVED   1988  . CESAREAN SECTION    . CESAREAN SECTION     X3  . COSMETIC SURGERY    . DILATATION & CURRETTAGE/HYSTEROSCOPY WITH RESECTOCOPE N/A 05/03/2014   Procedure: [place];  Surgeon: Thurnell Lose, MD;  Location: [place] [place] [facility];  Service: Gynecology;  Laterality: N/A;  . HERNIA REPAIR    . KNEE ARTHROSCOPY     LEFT AND RIGHT  . REDUCTION MAMMAPLASTY Bilateral    1998  . SHOULDER  ARTHROSCOPY    . SHOULDER SURGERY Left   . tummy tuck  2010    OB History    Gravida Para Term Preterm AB Living   0 0 0 0 0     SAB TAB Ectopic Multiple Live Births   0 0 0           Home Medications    Prior to Admission medications   Medication Sig Start Date End Date Taking? Authorizing Provider  amLODipine (NORVASC) 5 MG tablet Take 1 tablet by mouth daily. 02/19/15   [provider]  ARIPiprazole (ABILIFY) 15 MG tablet Take 15 mg by mouth daily.    [provider]  cetirizine (ZYRTEC ALLERGY) 10 MG tablet Take 10 mg by mouth daily as needed for allergies.    [provider]  cholecalciferol (VITAMIN D) 1000 units tablet Take 2,000 Units by mouth daily.     [provider]  clonazePAM (KLONOPIN) 0.5 MG tablet Take 0.5 mg by mouth daily. 09/05/16   [provider]  diphenhydrAMINE (BENADRYL) 25 mg capsule Take 25-50 mg by mouth daily as needed for allergies.    [provider]  Ferrous Sulfate 27 MG TABS Take 1 tablet by mouth daily.    [provider]  fluticasone (FLONASE) 50 MCG/ACT nasal spray Place 1 spray into both nostrils 2 (two) times daily.    [provider]  fluticasone (FLOVENT HFA) 110 MCG/ACT  inhaler Inhale 1 puff into the lungs 2 (two) times daily.     [provider]  gabapentin (NEURONTIN) 600 MG tablet Take 1,200 mg by mouth 3 (three) times daily.    [provider]  hydrochlorothiazide (HYDRODIURIL) 25 MG tablet Take 1 tablet by mouth daily. 08/18/16   [provider]  levalbuterol Penne Lash) 1.25 MG/3ML nebulizer solution Take 1.25 mg by nebulization every 4 (four) hours as needed for wheezing.    [provider]  levothyroxine (SYNTHROID, LEVOTHROID) 100 MCG tablet Take 100 mcg by mouth daily before breakfast.    [provider]  medroxyPROGESTERone (PROVERA) 10 MG tablet Take 10 mg by mouth See admin instructions. Takes only 10 days a month, starts  on the 1st and ends on the 10 th    [provider]  meloxicam (MOBIC) 15 MG tablet Take 15 mg by mouth daily.    [provider]  traMADol (ULTRAM) 50 MG tablet Take 50 mg by mouth every 6 (six) hours as needed.    [provider]    Family History Family History  Problem Relation Age of Onset  . Hypertension Other     Social History Social History   Tobacco Use  . Smoking status: Never Smoker  . Smokeless tobacco: Never Used  Substance Use Topics  . Alcohol use: No  . Drug use: No     Allergies   Shellfish allergy; Tandem plus [fefum-fepo-fa-b cmp-c-zn-mn-cu]; and Tandem [ferrous fum-iron polysacch]   Review of Systems Review of Systems  All other systems reviewed and are negative.    Physical Exam Updated Vital Signs BP (!) 169/82 (BP Location: Right Arm)   Pulse 88   Temp 98.4 F (36.9 C) (Oral)   Resp 18   SpO2 99%   Physical Exam  Constitutional: She is oriented to person, place, and time. She appears well-developed and well-nourished.  Non-toxic appearance. No distress.  HENT:  Head: Normocephalic and atraumatic.  Eyes: Conjunctivae, EOM and lids are normal. Pupils are equal, round, and reactive to light.  Neck: Normal range of motion. Neck supple. No tracheal deviation present. No thyroid mass present.  Cardiovascular: Normal rate, regular rhythm and normal heart sounds. Exam reveals no gallop.  No murmur heard. Pulmonary/Chest: Effort normal and breath sounds normal. No stridor. No respiratory distress. She has no decreased breath sounds. She has no wheezes. She has no rhonchi. She has no rales.  Abdominal: Soft. Normal appearance and bowel sounds are normal. She exhibits no distension. There is no tenderness. There is no rebound and no CVA tenderness.  Musculoskeletal: Normal range of motion. She exhibits no edema or tenderness.  Neurological: She is alert and oriented to person, place, and time. She has normal strength. No  cranial nerve deficit or sensory deficit. GCS eye subscore is 4. GCS verbal subscore is 5. GCS motor subscore is 6.  Skin: Skin is warm and dry. No abrasion and no rash noted.  Psychiatric: Her affect is blunt. Her speech is delayed. She is withdrawn. She expresses suicidal ideation. She expresses no homicidal ideation.  Nursing note and vitals reviewed.    ED Treatments / Results  Labs (all labs ordered are listed, but only abnormal results are displayed) Labs Reviewed  COMPREHENSIVE METABOLIC PANEL - Abnormal; Notable for the following components:      Result Value   Potassium 3.4 (*)    Chloride 100 (*)    Glucose, Bld 119 (*)    All other components within  normal limits  CBC  RAPID URINE DRUG SCREEN, HOSP PERFORMED  ETHANOL  SALICYLATE LEVEL  ACETAMINOPHEN LEVEL  I-STAT BETA HCG BLOOD, ED ([facility], [facility], [facility] ONLY)    EKG  EKG Interpretation None       Radiology No results found.  Procedures Procedures (including critical care time)  Medications Ordered in ED Medications  amLODipine (NORVASC) tablet 5 mg (not administered)  ARIPiprazole (ABILIFY) tablet 15 mg (not administered)  loratadine (CLARITIN) tablet 10 mg (not administered)  cholecalciferol (VITAMIN D) tablet 2,000 Units (not administered)  fluticasone (FLOVENT HFA) 110 MCG/ACT inhaler 1 puff (not administered)  gabapentin (NEURONTIN) tablet 1,200 mg (not administered)  meloxicam (MOBIC) tablet 15 mg (not administered)  medroxyPROGESTERone (PROVERA) tablet 10 mg (not administered)  levothyroxine (SYNTHROID, LEVOTHROID) tablet 100 mcg (not administered)  hydrochlorothiazide (HYDRODIURIL) tablet 25 mg (not administered)  Ferrous Sulfate TABS 27 mg (not administered)  fluticasone (FLONASE) 50 MCG/ACT nasal spray 1 spray (not administered)  albuterol (PROVENTIL) (5 MG/ML) 0.5% nebulizer solution 2.5 mg (not administered)  traMADol (ULTRAM) tablet 50 mg (not administered)     Initial Impression / Assessment and  Plan / ED Course  I have reviewed the triage vital signs and the nursing notes.  Pertinent labs & imaging results that were available during my care of the patient were reviewed by me and considered in my medical decision making (see chart for details).     Patient is medically cleared for psychiatric disposition Final Clinical Impressions(s) / ED Diagnoses   Final diagnoses:  None    ED Discharge Orders    None       Lacretia Leigh, MD 07/08/17 1342

## 2017-10-13 ENCOUNTER — Emergency Department (HOSPITAL_COMMUNITY): Payer: Federal, State, Local not specified - PPO

## 2017-10-13 ENCOUNTER — Encounter (HOSPITAL_COMMUNITY): Payer: Self-pay | Admitting: Emergency Medicine

## 2017-10-13 ENCOUNTER — Other Ambulatory Visit: Payer: Self-pay

## 2017-10-13 ENCOUNTER — Emergency Department (HOSPITAL_COMMUNITY)
Admission: EM | Admit: 2017-10-13 | Discharge: 2017-10-13 | Disposition: A | Discharge status: Home / Self Care | Payer: Federal, State, Local not specified - PPO | Attending: Emergency Medicine | Admitting: Emergency Medicine

## 2017-10-13 DIAGNOSIS — J45909 Unspecified asthma, uncomplicated: Secondary | ICD-10-CM | POA: Diagnosis not present

## 2017-10-13 DIAGNOSIS — Z79899 Other long term (current) drug therapy: Secondary | ICD-10-CM | POA: Diagnosis not present

## 2017-10-13 DIAGNOSIS — R1032 Left lower quadrant pain: Secondary | ICD-10-CM | POA: Diagnosis not present

## 2017-10-13 DIAGNOSIS — E039 Hypothyroidism, unspecified: Secondary | ICD-10-CM | POA: Insufficient documentation

## 2017-10-13 DIAGNOSIS — R109 Unspecified abdominal pain: Secondary | ICD-10-CM | POA: Diagnosis present

## 2017-10-13 LAB — COMPREHENSIVE METABOLIC PANEL
ALBUMIN: 4.2 g/dL (ref 3.5–5.0)
ALT: 16 U/L (ref 14–54)
ANION GAP: 9 (ref 5–15)
AST: 19 U/L (ref 15–41)
Alkaline Phosphatase: 60 U/L (ref 38–126)
BUN: 7 mg/dL (ref 6–20)
CO2: 28 mmol/L (ref 22–32)
Calcium: 9.4 mg/dL (ref 8.9–10.3)
Chloride: 101 mmol/L (ref 101–111)
Creatinine, Ser: 0.83 mg/dL (ref 0.44–1.00)
GFR calc Af Amer: >60 mL/min (ref >60)
GFR calc non Af Amer: >60 mL/min (ref >60)
Glucose, Bld: 88 mg/dL (ref 65–99)
POTASSIUM: 3.3 mmol/L — AB (ref 3.5–5.1)
SODIUM: 138 mmol/L (ref 135–145)
Total Bilirubin: 0.6 mg/dL (ref 0.3–1.2)
Total Protein: 7.4 g/dL (ref 6.5–8.1)

## 2017-10-13 LAB — LIPASE, BLOOD: Lipase: 20 U/L (ref 11–51)

## 2017-10-13 LAB — URINALYSIS, ROUTINE W REFLEX MICROSCOPIC
Bilirubin Urine: NEGATIVE
Glucose, UA: NEGATIVE mg/dL
HGB URINE DIPSTICK: NEGATIVE
Ketones, ur: NEGATIVE mg/dL
Leukocytes, UA: NEGATIVE
NITRITE: NEGATIVE
PH: 8 (ref 5.0–8.0)
Protein, ur: NEGATIVE mg/dL
SPECIFIC GRAVITY, URINE: 1.004 — AB (ref 1.005–1.030)

## 2017-10-13 LAB — CBC
HEMATOCRIT: 37.8 % (ref 36.0–46.0)
HEMOGLOBIN: 12.5 g/dL (ref 12.0–15.0)
MCH: 26.9 pg (ref 26.0–34.0)
MCHC: 33.1 g/dL (ref 30.0–36.0)
MCV: 81.3 fL (ref 78.0–100.0)
Platelets: 265 10*3/uL (ref 150–400)
RBC: 4.65 MIL/uL (ref 3.87–5.11)
RDW: 12.5 % (ref 11.5–15.5)
WBC: 8.4 10*3/uL (ref 4.0–10.5)

## 2017-10-13 LAB — I-STAT BETA HCG BLOOD, ED (MC, WL, AP ONLY): I-stat hCG, quantitative: <5 m[IU]/mL (ref <5)

## 2017-10-13 MED ORDER — HYDROMORPHONE HCL 1 MG/ML IJ SOLN
0.5000 mg | Freq: Once | INTRAMUSCULAR | Status: AC
  Administered 2017-10-13: 0.5 mg via INTRAVENOUS
  Filled 2017-10-13: qty 1

## 2017-10-13 MED ORDER — KETOROLAC TROMETHAMINE 30 MG/ML IJ SOLN
15.0000 mg | Freq: Once | INTRAMUSCULAR | Status: AC
  Administered 2017-10-13: 15 mg via INTRAVENOUS
  Filled 2017-10-13: qty 1

## 2017-10-13 MED ORDER — POTASSIUM CHLORIDE CRYS ER 20 MEQ PO TBCR
40.0000 meq | EXTENDED_RELEASE_TABLET | Freq: Once | ORAL | Status: AC
  Administered 2017-10-13: 40 meq via ORAL
  Filled 2017-10-13: qty 2

## 2017-10-13 MED ORDER — OXYCODONE-ACETAMINOPHEN 5-325 MG PO TABS
2.0000 | ORAL_TABLET | Freq: Once | ORAL | Status: AC
  Administered 2017-10-13: 2 via ORAL
  Filled 2017-10-13: qty 2

## 2017-10-13 MED ORDER — IOPAMIDOL (ISOVUE-300) INJECTION 61%
INTRAVENOUS | Status: AC
  Administered 2017-10-13: 100 mL
  Filled 2017-10-13: qty 100

## 2017-10-13 MED ORDER — IBUPROFEN 800 MG PO TABS: 800.0000 mg | ORAL_TABLET | Freq: Three times a day (TID) | ORAL | 0 refills | Status: DC

## 2017-10-13 MED ORDER — ONDANSETRON HCL 4 MG/2ML IJ SOLN
4.0000 mg | Freq: Once | INTRAMUSCULAR | Status: AC
Start: 2017-10-13 — End: 2017-10-13
  Administered 2017-10-13: 4 mg via INTRAVENOUS
  Filled 2017-10-13: qty 2

## 2017-10-13 MED ORDER — OXYCODONE-ACETAMINOPHEN 5-325 MG PO TABS: 1.0000 | ORAL_TABLET | ORAL | 0 refills | Status: DC | PRN

## 2017-10-13 NOTE — Discharge Instructions (Signed)
Take percocet for breakthrough pain, do not drink alcohol, drive, care for children or do other critical tasks while taking percocet. ° °Please follow with your primary care doctor in the next 2 days for a check-up. They must obtain records for further management.  ° °Do not hesitate to return to the Emergency Department for any new, worsening or concerning symptoms.  ° °

## 2017-10-13 NOTE — ED Notes (Signed)
Coming from PCP-states history of diverticulitis-states LLQ pain-sending over for eval

## 2017-10-13 NOTE — ED Provider Notes (Signed)
[facility] DEPT Provider Note   CSN: [id number] Arrival date & time: 10/13/17  1606     History   Chief Complaint Chief Complaint  Patient presents with  . Abdominal Pain    HPI   Blood pressure (!) 133/92, pulse 79, temperature 98.1 F (36.7 C), temperature source Oral, resp. rate 18, SpO2 99 %.  Joy Cook is a 52 y.o. female complaining of worsening left lower quadrant abdominal pain starting 1 week ago not alleviated by tramadol.  There is been no associated nausea, vomiting, fever, diarrhea, melena, hematochezia, urinary symptoms, vaginal discharge.  She states the pain has been constant and worsening over the course of week.  Feels similar to prior diverticulitis exacerbation.  Past Medical History:  Diagnosis Date  . Asthma   . GERD (gastroesophageal reflux disease)   . Hypothyroidism   . Thyroid disease     Patient Active Problem List   Diagnosis Date Noted  . Palpitations 10/16/2016  . Dizziness 10/16/2016  . Chest pain 10/16/2016  . Diverticulitis large intestine 09/24/2016  . Intractable abdominal pain 09/24/2016  . Chronic pain syndrome 09/24/2016  . GERD (gastroesophageal reflux disease) 09/24/2016  . Asthma 09/24/2016  . Hypothyroidism 09/24/2016  . Hypokalemia 09/24/2016    Past Surgical History:  Procedure Laterality Date  . BREAST REDUCTION SURGERY    . BREAST SURGERY    . BREAST SURGERY  LUMP REMOVED   1988  . CESAREAN SECTION    . CESAREAN SECTION     X3  . COSMETIC SURGERY    . DILATATION & CURRETTAGE/HYSTEROSCOPY WITH RESECTOCOPE N/A 05/03/2014   Procedure: [place];  Surgeon: Thurnell Lose, MD;  Location: [facility];  Service: Gynecology;  Laterality: N/A;  . HERNIA REPAIR    . KNEE ARTHROSCOPY     LEFT AND RIGHT  . REDUCTION MAMMAPLASTY Bilateral    1998  . SHOULDER ARTHROSCOPY    . SHOULDER SURGERY Left   . tummy tuck  2010    OB History    Gravida  Para Term Preterm AB Living   0 0 0 0 0     SAB TAB Ectopic Multiple Live Births   0 0 0           Home Medications    Prior to Admission medications   Medication Sig Start Date End Date Taking? Authorizing Provider  acetaminophen (TYLENOL) 500 MG tablet Take 1,000 mg by mouth every 6 (six) hours as needed for moderate pain.   Yes [provider]  amLODipine (NORVASC) 5 MG tablet Take 1 tablet by mouth daily. 02/19/15  Yes [provider]  ARIPiprazole (ABILIFY) 15 MG tablet Take 15 mg by mouth daily.   Yes [provider]  cholecalciferol (VITAMIN D) 1000 units tablet Take 2,000 Units by mouth daily.    Yes [provider]  clonazePAM (KLONOPIN) 0.5 MG tablet Take 0.5 mg by mouth daily. 09/05/16  Yes [provider]  Ferrous Sulfate 27 MG TABS Take 1 tablet by mouth daily.   Yes [provider]  fluticasone (FLONASE) 50 MCG/ACT nasal spray Place 1 spray into both nostrils 2 (two) times daily.   Yes [provider]  fluticasone (FLOVENT HFA) 110 MCG/ACT inhaler Inhale 1 puff into the lungs 2 (two) times daily.    Yes [provider]  gabapentin (NEURONTIN) 600 MG tablet Take 600 mg by mouth 3 (three) times daily.    Yes [provider]  hydrochlorothiazide (HYDRODIURIL) 25 MG tablet Take 1 tablet by mouth daily. 08/18/16  Yes [provider]  levothyroxine (SYNTHROID, LEVOTHROID) 100 MCG tablet Take 100 mcg by mouth daily before breakfast.   Yes [provider]  ondansetron (ZOFRAN-ODT) 4 MG disintegrating tablet Take 1 tablet by mouth every 8 (eight) hours as needed for nausea/vomiting. 06/03/17  Yes [provider]  pantoprazole (PROTONIX) 20 MG tablet Take 20 mg by mouth 2 (two) times daily. 10/09/17  Yes [provider]  perphenazine (TRILAFON) 8 MG tablet Take 8 mg by mouth 3 (three) times daily. 07/17/17  Yes [provider]  polyethylene glycol (MIRALAX / GLYCOLAX)  packet Take 17 g by mouth 2 (two) times daily.   Yes [provider]  traMADol (ULTRAM) 50 MG tablet Take 50 mg by mouth every 6 (six) hours as needed for moderate pain.    Yes [provider]  acetaminophen (TYLENOL) 650 MG CR tablet Take 1,300 mg by mouth every 8 (eight) hours as needed for pain.    [provider]  cetirizine (ZYRTEC ALLERGY) 10 MG tablet Take 10 mg by mouth daily as needed for allergies.    [provider]  diphenhydrAMINE (BENADRYL) 25 mg capsule Take 25-50 mg by mouth daily as needed for allergies.    [provider]  ibuprofen (ADVIL,MOTRIN) 800 MG tablet Take 1 tablet (800 mg total) by mouth 3 (three) times daily. 10/13/17   Aubrey Voong, Elmyra Ricks, PA-C  levalbuterol (XOPENEX) 1.25 MG/3ML nebulizer solution Take 1.25 mg by nebulization every 4 (four) hours as needed for wheezing.    [provider]  OVER THE COUNTER MEDICATION Apply 1 application topically. CRAMER ANALGESIC CREAM-4 TO 5 TIMES DAILY    [provider]  oxyCODONE-acetaminophen (PERCOCET) 5-325 MG tablet Take 1-2 tablets by mouth every 4 (four) hours as needed. 10/13/17   Zulma Court, Elmyra Ricks, PA-C    Family History Family History  Problem Relation Age of Onset  . Hypertension Other     Social History Social History   Tobacco Use  . Smoking status: Never Smoker  . Smokeless tobacco: Never Used  Substance Use Topics  . Alcohol use: No  . Drug use: No     Allergies   Shellfish allergy; Tandem plus [[id number]-[id number]]; and Tandem [ferrous fum-iron polysacch]   Review of Systems Review of Systems  A complete review of systems was obtained and all systems are negative except as noted in the HPI and PMH.    Physical Exam Updated Vital Signs BP (!) 133/92 (BP Location: Left Arm)   Pulse 79   Temp 98.1 F (36.7 C) (Oral)   Resp 18   SpO2 99%   Physical Exam  Constitutional: She is oriented to person, place, and time. She  appears well-developed and well-nourished. No distress.  HENT:  Head: Normocephalic and atraumatic.  Mouth/Throat: Oropharynx is clear and moist.  Eyes: Conjunctivae and EOM are normal. Pupils are equal, round, and reactive to light.  Neck: Normal range of motion.  Cardiovascular: Normal rate, regular rhythm and intact distal pulses.  Pulmonary/Chest: Effort normal and breath sounds normal.  Abdominal: Soft. There is no tenderness.  Tender to palpation in the left upper and left lower quadrant, voluntary guarding.  No rebound.  Musculoskeletal: Normal range of motion.  Neurological: She is alert and oriented to person, place, and time.  Skin: She is not diaphoretic.  Psychiatric: She has a normal mood and affect.  Nursing note and vitals reviewed.  ED Treatments / Results  Labs (all labs ordered are listed, but only abnormal results are displayed) Labs Reviewed  COMPREHENSIVE METABOLIC PANEL - Abnormal; Notable for the following components:      Result Value   Potassium 3.3 (*)    All other components within normal limits  URINALYSIS, ROUTINE W REFLEX MICROSCOPIC - Abnormal; Notable for the following components:   Color, Urine STRAW (*)    Specific Gravity, Urine 1.004 (*)    All other components within normal limits  LIPASE, BLOOD  CBC  I-STAT BETA HCG BLOOD, ED ([facility], [facility], [facility] ONLY)    EKG  EKG Interpretation None       Radiology Ct Abdomen Pelvis W Contrast  Result Date: 10/13/2017 CLINICAL DATA:  Left lower quadrant abdominal pain EXAM: CT ABDOMEN AND PELVIS WITH CONTRAST TECHNIQUE: Multidetector CT imaging of the abdomen and pelvis was performed using the standard protocol following bolus administration of intravenous contrast. CONTRAST:  157mL ISOVUE-300 IOPAMIDOL (ISOVUE-300) INJECTION 61% COMPARISON:  CT 09/23/2016 FINDINGS: Lower chest: Lung bases demonstrate no acute consolidation or pleural effusion. Heart size within normal limits. Hepatobiliary: Stable cyst in  the left hepatic lobe. No calcified gallstones or biliary dilatation Pancreas: Unremarkable. No pancreatic ductal dilatation or surrounding inflammatory changes. Spleen: Normal in size without focal abnormality. Adrenals/Urinary Tract: Adrenal glands are unremarkable. Kidneys are normal, without renal calculi, focal lesion, or hydronephrosis. Bladder is unremarkable. Stomach/Bowel: Stomach is within normal limits. Appendix appears normal. No evidence of bowel wall thickening, distention, or inflammatory changes. Sigmoid colon and descending colon diverticula without acute inflammation. Vascular/Lymphatic: Nonaneurysmal aorta. No significantly enlarged lymph nodes. Reproductive: Slightly thickened endometrial stripe up to 1 cm. Small cysts in the left adnexa. Probable cysts in the cervix. Air within the vagina. Other: Negative for free air or free fluid. Small fat containing periumbilical hernia. Musculoskeletal: No acute or significant osseous findings. IMPRESSION: 1. No CT evidence for acute intra-abdominal or pelvic abnormality. Descending colon and sigmoid colon diverticula without definitive inflammation 2. Endometrial stripe thickness up to 1 cm, correlate with menstrual status. Electronically Signed   By: Donavan Foil M.D.   On: 10/13/2017 21:50    Procedures Procedures (including critical care time)  Medications Ordered in ED Medications  potassium chloride SA (K-DUR,KLOR-CON) CR tablet 40 mEq (not administered)  oxyCODONE-acetaminophen (PERCOCET/ROXICET) 5-325 MG per tablet 2 tablet (not administered)  ketorolac (TORADOL) 30 MG/ML injection 15 mg (not administered)  HYDROmorphone (DILAUDID) injection 0.5 mg (0.5 mg Intravenous Given 10/13/17 2102)  ondansetron (ZOFRAN) injection 4 mg (4 mg Intravenous Given 10/13/17 2101)  iopamidol (ISOVUE-300) 61 % injection (100 mLs  Contrast Given 10/13/17 2129)     Initial Impression / Assessment and Plan / ED Course  I have reviewed the triage vital signs  and the nursing notes.  Pertinent labs & imaging results that were available during my care of the patient were reviewed by me and considered in my medical decision making (see chart for details).     Vitals:   10/13/17 1621 10/13/17 2016  BP: (!) 138/105 (!) 133/92  Pulse: 74 79  Resp: 16 18  Temp: 98.1 F (36.7 C)   TempSrc: Oral   SpO2: 98% 99%    Medications  potassium chloride SA (K-DUR,KLOR-CON) CR tablet 40 mEq (not administered)  oxyCODONE-acetaminophen (PERCOCET/ROXICET) 5-325 MG per tablet 2 tablet (not administered)  ketorolac (TORADOL) 30 MG/ML injection 15 mg (not administered)  HYDROmorphone (DILAUDID) injection 0.5 mg (0.5 mg Intravenous Given 10/13/17 2102)  ondansetron (ZOFRAN) injection  4 mg (4 mg Intravenous Given 10/13/17 2101)  iopamidol (ISOVUE-300) 61 % injection (100 mLs  Contrast Given 10/13/17 2129)    Arrionna Serena Rings is 52 y.o. female presenting with 10 out of 10 left lower quadrant abdominal pain, abdominal exam is nonsurgical however she does appear very uncomfortable.  Vital signs reassuring, blood work without significant abnormality, Dilaudid and CT to rule out diverticulitis.  Blood work reassuring, mild hypokalemia.  Abdominal CT with no signs of diverticulitis, they do note a 1 cm endometrial stripe.  This patient has had perimenopausal symptoms, she is being managed by [facility] she states that she was on a series of estrogen to suppress her menses over the summer and she has not menstruated in 6 months.  This may be premenstrual cramping.  Discussed with attending physician and given the progressive and constant nature of her pain would doubt torsion given the normal CT scan.  Extensive discussion of return precautions with this patient who verbalized understanding teach back technique.  Evaluation does not show pathology that would require ongoing emergent intervention or inpatient treatment. Pt is hemodynamically stable and mentating appropriately.  Discussed findings and plan with patient/guardian, who agrees with care plan. All questions answered. Return precautions discussed and outpatient follow up given.      Final Clinical Impressions(s) / ED Diagnoses   Final diagnoses:  LLQ abdominal pain    ED Discharge Orders        Ordered    oxyCODONE-acetaminophen (PERCOCET) 5-325 MG tablet  Every 4 hours PRN     10/13/17 2254    ibuprofen (ADVIL,MOTRIN) 800 MG tablet  3 times daily     10/13/17 2254       Joshlynn Alfonzo, Charna Elizabeth 10/13/17 2258    Duffy Bruce, MD 10/14/17 [phone]

## 2017-10-13 NOTE — ED Triage Notes (Signed)
Pt sent from [facility] related to ongoing LLQ pain with associated nausea worsening over past week. Denies GU symptoms.

## 2017-10-30 DIAGNOSIS — M1711 Unilateral primary osteoarthritis, right knee: Secondary | ICD-10-CM | POA: Insufficient documentation

## 2017-10-30 DIAGNOSIS — M1712 Unilateral primary osteoarthritis, left knee: Secondary | ICD-10-CM | POA: Insufficient documentation

## 2017-11-23 DIAGNOSIS — M19019 Primary osteoarthritis, unspecified shoulder: Secondary | ICD-10-CM | POA: Insufficient documentation

## 2017-12-08 DIAGNOSIS — M7501 Adhesive capsulitis of right shoulder: Secondary | ICD-10-CM | POA: Insufficient documentation

## 2017-12-08 DIAGNOSIS — M7580 Other shoulder lesions, unspecified shoulder: Secondary | ICD-10-CM | POA: Insufficient documentation

## 2018-01-21 ENCOUNTER — Other Ambulatory Visit: Payer: Self-pay | Admitting: Obstetrics and Gynecology

## 2018-01-21 DIAGNOSIS — Z1231 Encounter for screening mammogram for malignant neoplasm of breast: Secondary | ICD-10-CM

## 2018-02-26 ENCOUNTER — Ambulatory Visit
Admission: RE | Admit: 2018-02-26 | Discharge: 2018-02-26 | Disposition: A | Discharge status: Home / Self Care | Payer: Federal, State, Local not specified - PPO | Source: Ambulatory Visit | Attending: Obstetrics and Gynecology | Admitting: Obstetrics and Gynecology

## 2018-02-26 DIAGNOSIS — Z1231 Encounter for screening mammogram for malignant neoplasm of breast: Secondary | ICD-10-CM

## 2018-08-05 IMAGING — RF DG SMALL BOWEL
1 series · 15 of 16 positions shown · non-contrast
Comparison: None.

CLINICAL DATA: Abdominal bloating, evaluate for possible Crohn's
disease

EXAM:
SMALL BOWEL SERIES
TECHNIQUE: Following ingestion of thin barium, serial small bowel images were
obtained including spot views of the terminal ileum.
FLUOROSCOPY TIME:  Fluoroscopy Time:  24 seconds of fluoroscopy time
Radiation Exposure Index (if provided by the fluoroscopic device):
510 mGy
Number of Acquired Spot Images: 0

[Series 1: one shot · 15 of 16 slices shown]
[im 1/16]
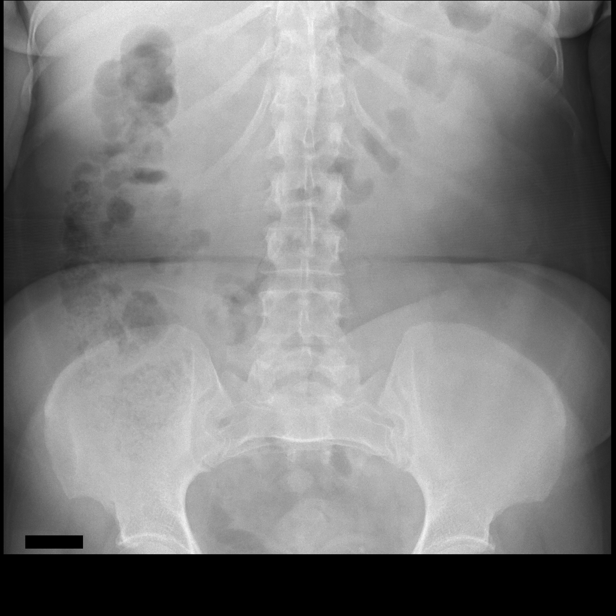
[im 2/16]
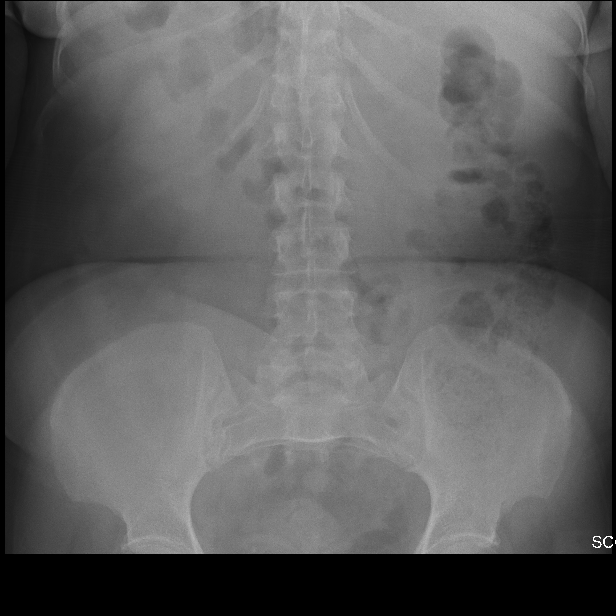
[im 3/16]
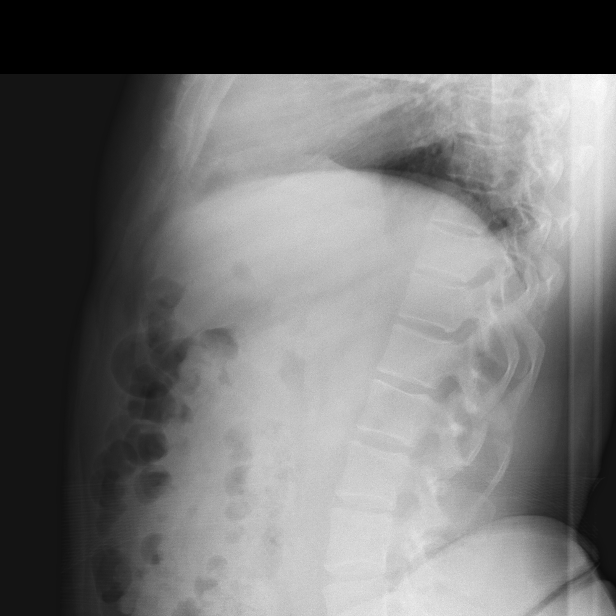
[im 4/16]
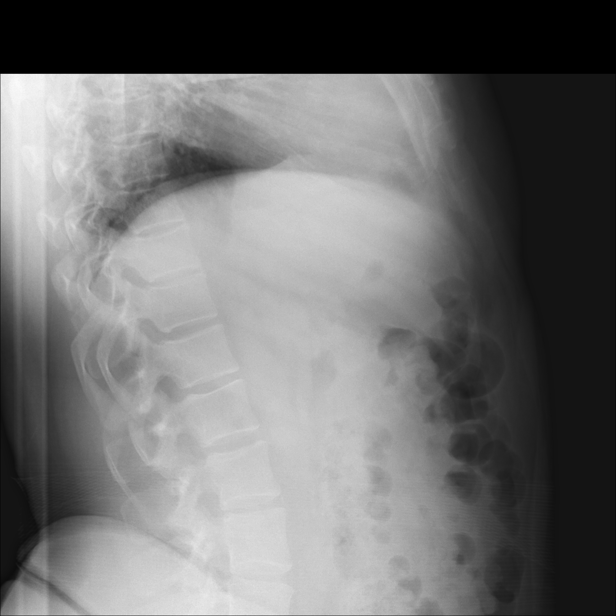
[im 5/16]
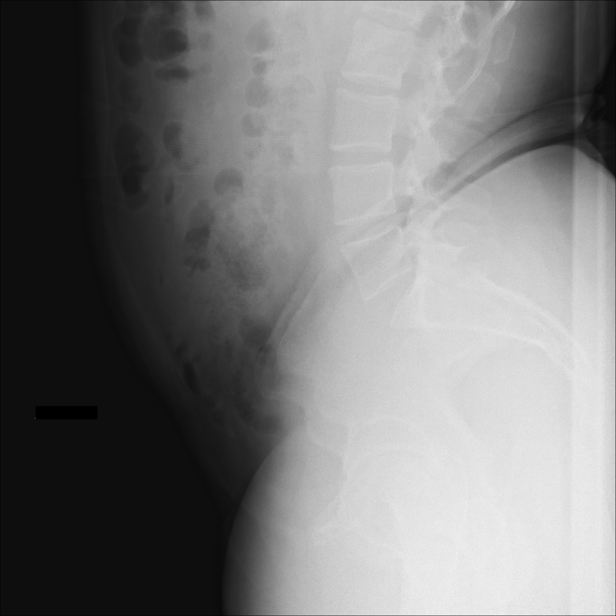
[im 6/16]
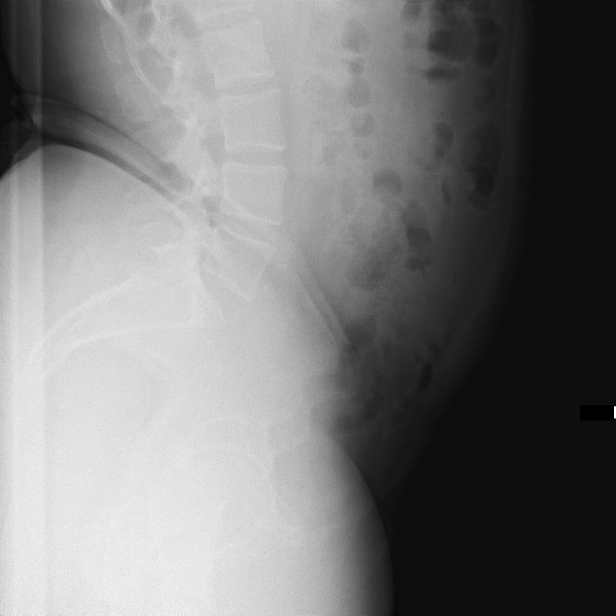
[im 7/16]
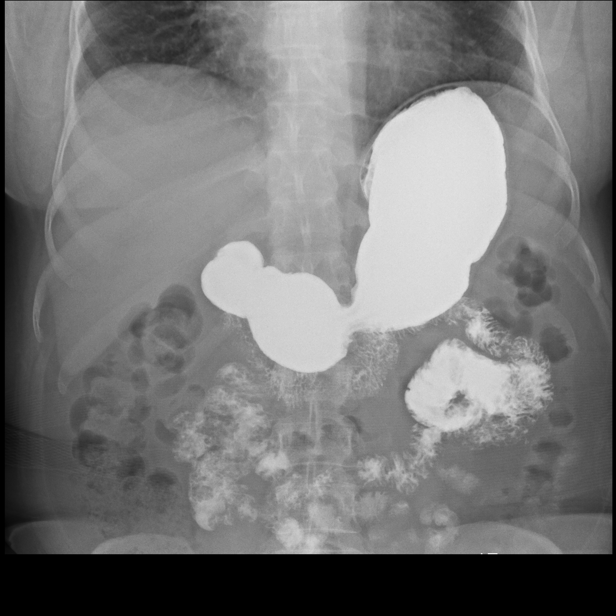
[im 9/16]
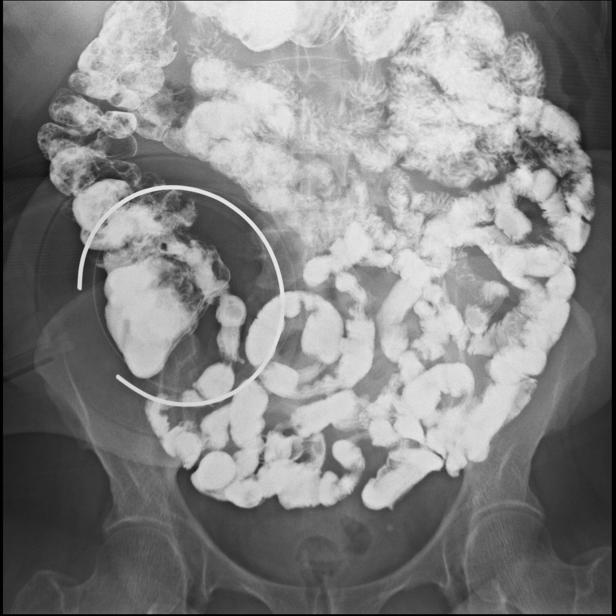
[im 10/16]
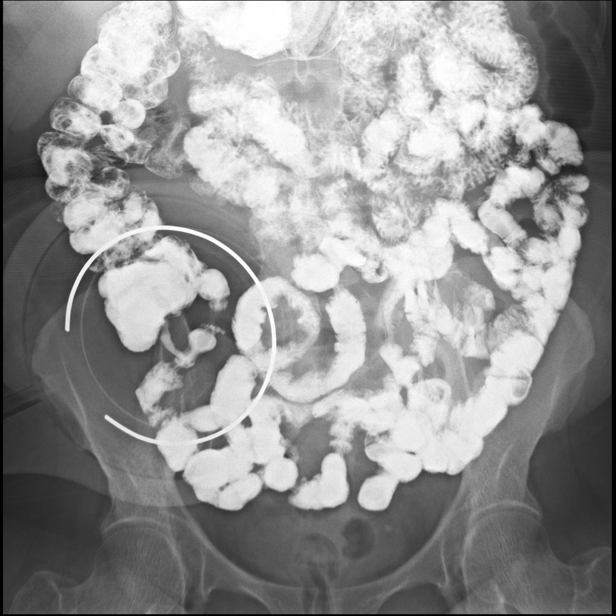
[im 11/16]
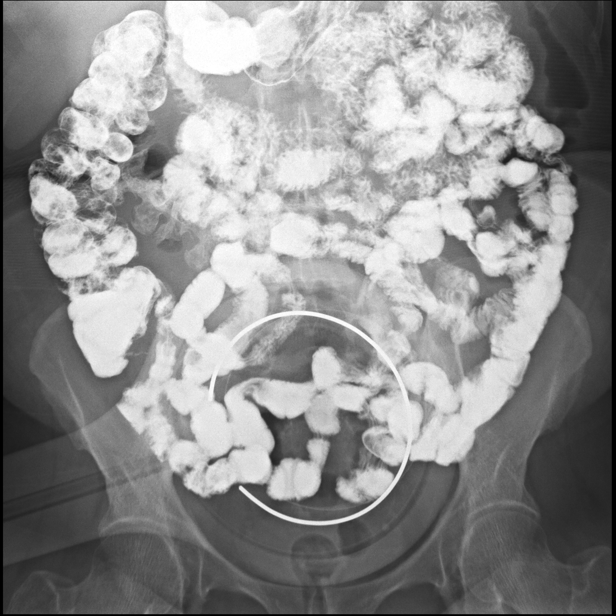
[im 12/16]
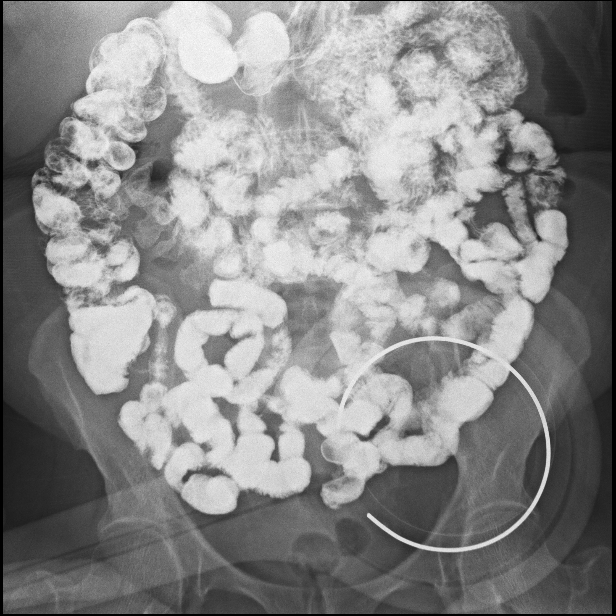
[im 13/16]
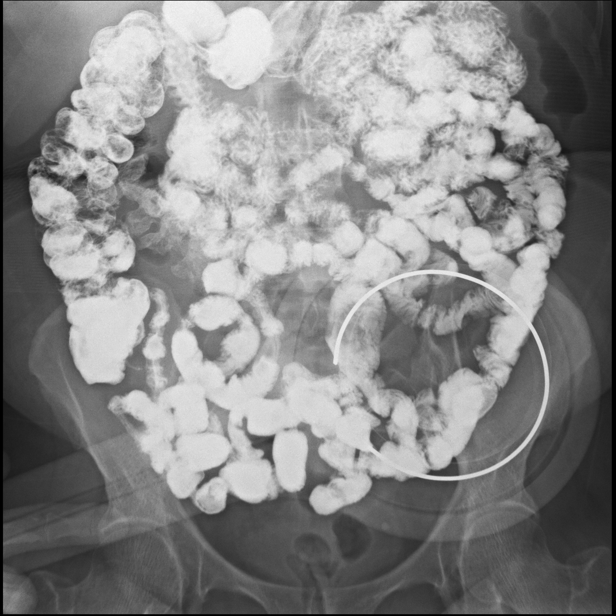
[im 14/16]
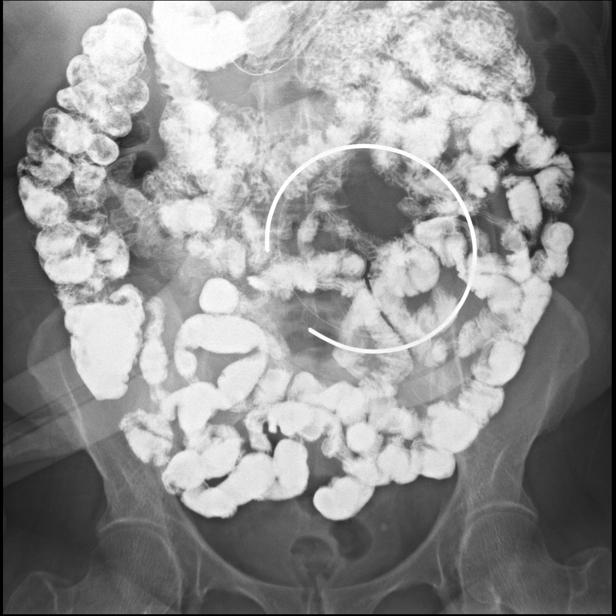
[im 15/16]
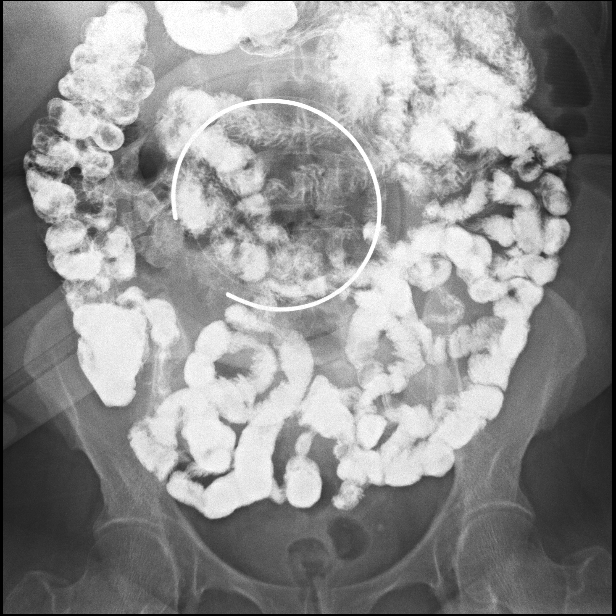
[im 16/16]
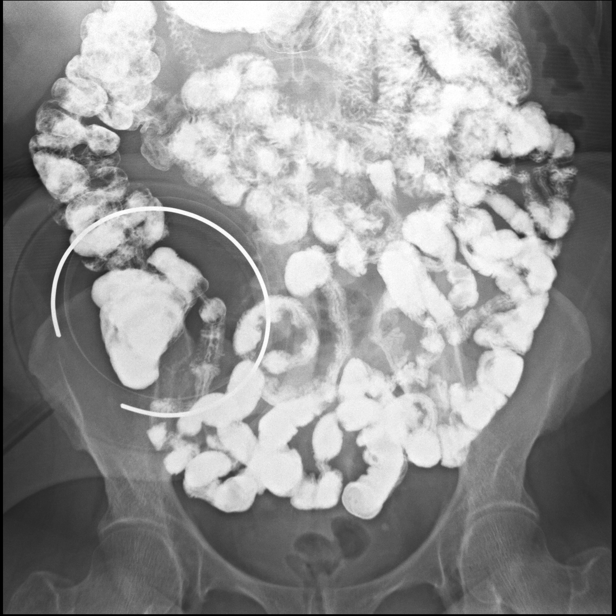

[15 of 16 positions shown; findings below may reference images not displayed]

FINDINGS: A preliminary film of the abdomen shows a nonspecific bowel gas
pattern. A rounded calcification in the left bony pelvis may
represent phlebolith, but a distal left ureteral calculus cannot be
excluded. In view of the patient's symptoms a crosstable lateral
view is obtained showing no definite abdominal wall hernia.

Barium was given orally and images of the small bowel were obtained.
The mucosal pattern of the small bowel appears normal. No edema,
mass, or displacement of small bowel loops is seen. The terminal
ileum is well visualized with no abnormality noted.
IMPRESSION: Negative small bowel follow-through. The terminal ileum is
unremarkable.

## 2018-09-08 IMAGING — CR DG ABDOMEN ACUTE W/ 1V CHEST
4 series · 4 of 4 positions shown · non-contrast
Comparison: CXR 09/10/2016, CT 09/05/2016

CLINICAL DATA: Left-sided gastric and back pain for 2 weeks.
History of diverticulitis diagnosed 2 weeks ago. Chills.

EXAM:
DG ABDOMEN ACUTE W/ 1V CHEST

[x chest ap]
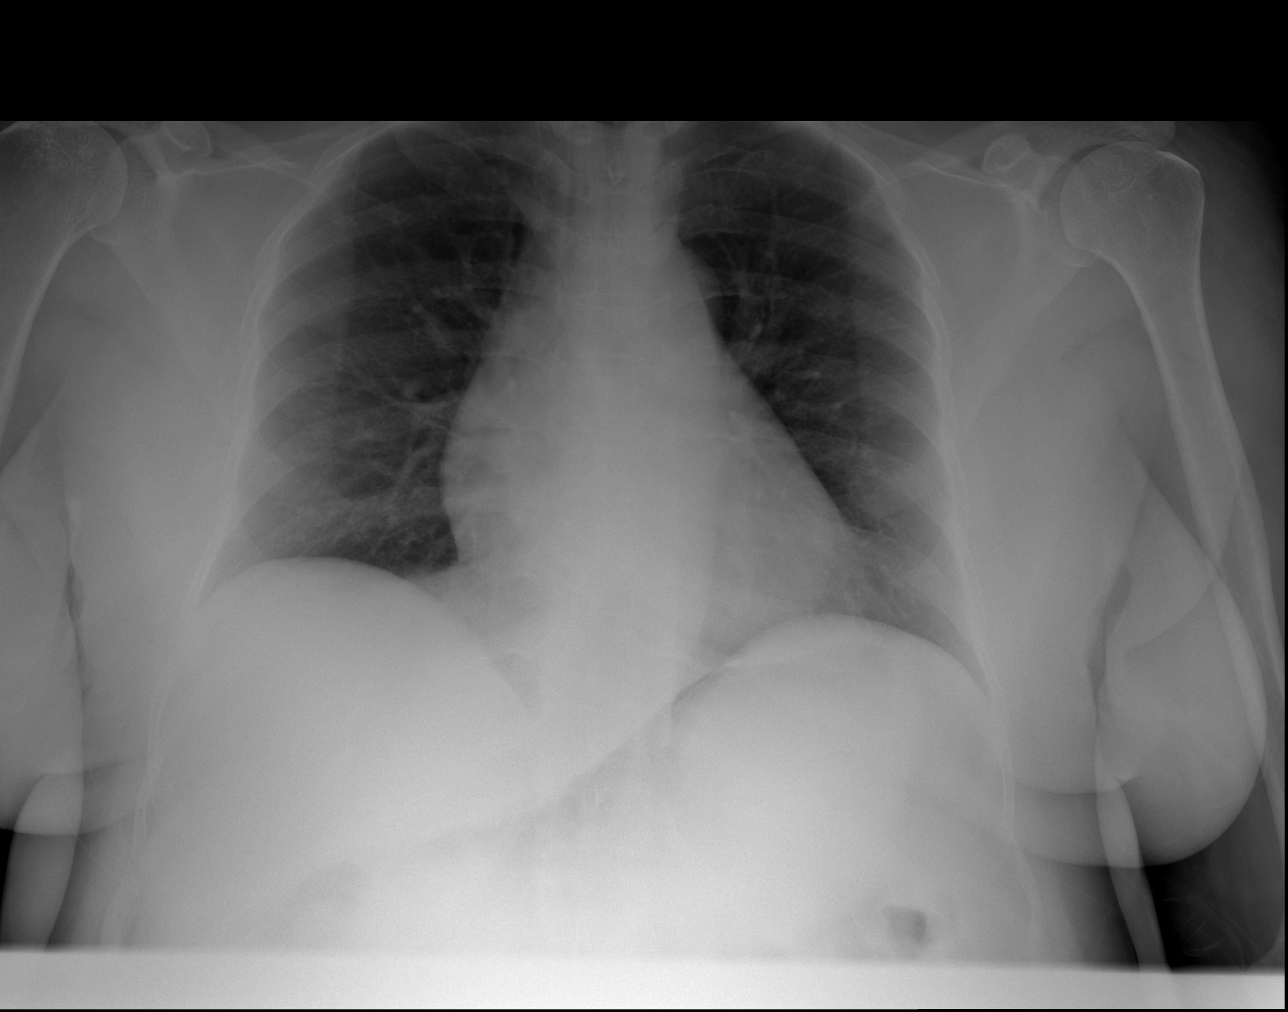

[w abdomen decub]
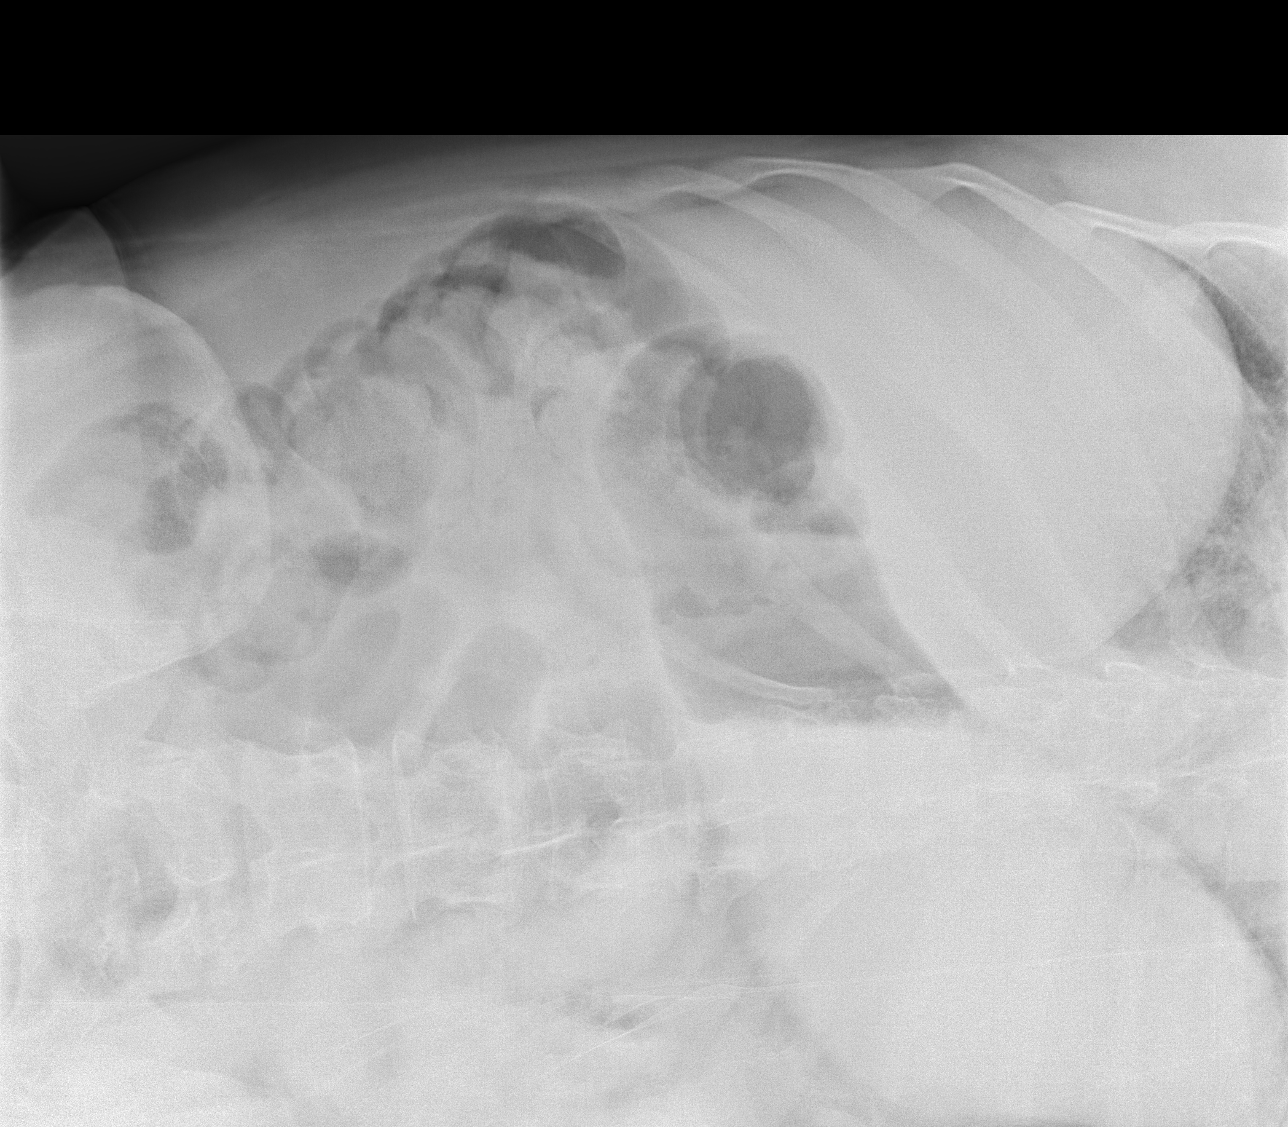

[t abdomen supine (1 of 2)]
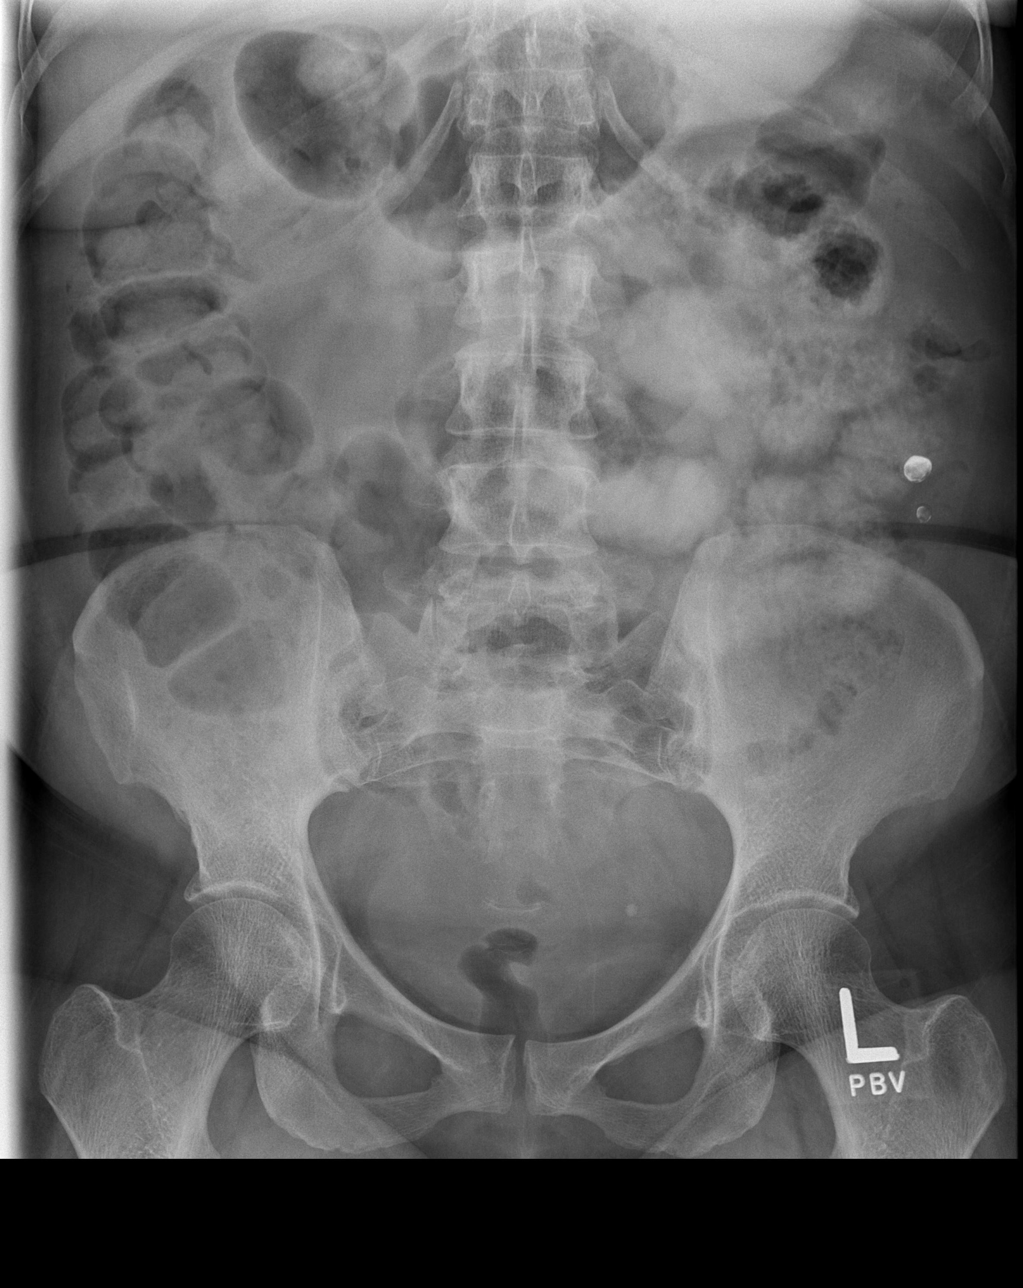

[t abdomen supine (2 of 2)]
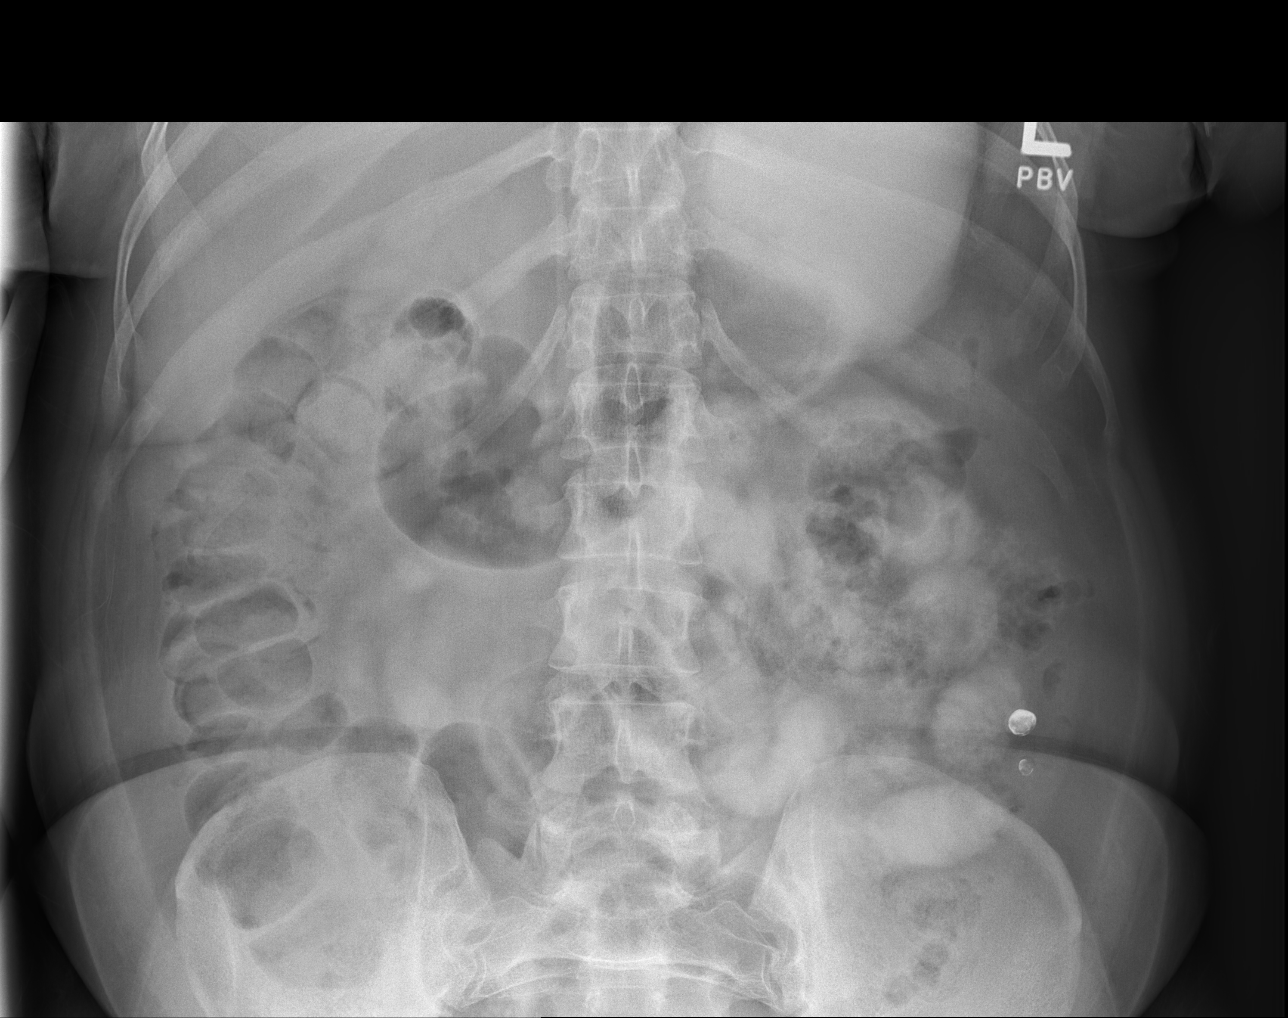

[4 of 4 positions shown; findings below may reference images not displayed]

FINDINGS: Heart and mediastinal contours within normal limits. Lungs are
clear. Moderate colonic stool burden of along the right colon. No
small bowel obstruction. Enteric contrast is seen within the stomach
and proximal small bowel. Contrast filled diverticulum noted the
descending colon from prior CT. There is no free air. No acute
osseous abnormality. No suspicious calculi.
IMPRESSION: Moderate colonic stool burden along the right colon. No bowel
obstruction nor suspicious calculi. No acute cardiopulmonary
disease.

## 2019-01-24 ENCOUNTER — Other Ambulatory Visit: Payer: Self-pay | Admitting: Obstetrics and Gynecology

## 2019-01-24 DIAGNOSIS — Z1231 Encounter for screening mammogram for malignant neoplasm of breast: Secondary | ICD-10-CM

## 2019-02-22 DIAGNOSIS — Z79891 Long term (current) use of opiate analgesic: Secondary | ICD-10-CM | POA: Insufficient documentation

## 2019-02-22 DIAGNOSIS — M5030 Other cervical disc degeneration, unspecified cervical region: Secondary | ICD-10-CM | POA: Insufficient documentation

## 2019-03-11 ENCOUNTER — Ambulatory Visit
Admission: RE | Admit: 2019-03-11 | Discharge: 2019-03-11 | Disposition: A | Discharge status: Home / Self Care | Payer: Medicare Other | Source: Ambulatory Visit | Attending: Obstetrics and Gynecology | Admitting: Obstetrics and Gynecology

## 2019-03-11 ENCOUNTER — Other Ambulatory Visit: Payer: Self-pay

## 2019-03-11 DIAGNOSIS — Z1231 Encounter for screening mammogram for malignant neoplasm of breast: Secondary | ICD-10-CM

## 2019-05-18 ENCOUNTER — Other Ambulatory Visit: Payer: Self-pay | Admitting: Obstetrics and Gynecology

## 2019-06-21 ENCOUNTER — Other Ambulatory Visit (HOSPITAL_COMMUNITY)
Admission: RE | Admit: 2019-06-21 | Discharge: 2019-06-21 | Disposition: A | Discharge status: Home / Self Care | Payer: Medicare Other | Source: Ambulatory Visit | Attending: Obstetrics and Gynecology | Admitting: Obstetrics and Gynecology

## 2019-06-21 ENCOUNTER — Other Ambulatory Visit: Payer: Self-pay | Admitting: Obstetrics and Gynecology

## 2019-06-21 DIAGNOSIS — Z124 Encounter for screening for malignant neoplasm of cervix: Secondary | ICD-10-CM | POA: Diagnosis present

## 2019-06-21 DIAGNOSIS — Z1151 Encounter for screening for human papillomavirus (HPV): Secondary | ICD-10-CM | POA: Insufficient documentation

## 2019-06-24 LAB — CYTOLOGY - PAP
Comment: NEGATIVE
Diagnosis: NEGATIVE
High risk HPV: NEGATIVE

## 2019-11-20 ENCOUNTER — Other Ambulatory Visit: Payer: Self-pay

## 2019-11-20 ENCOUNTER — Encounter (HOSPITAL_COMMUNITY): Payer: Self-pay | Admitting: Emergency Medicine

## 2019-11-20 ENCOUNTER — Emergency Department (HOSPITAL_COMMUNITY)
Admission: EM | Admit: 2019-11-20 | Discharge: 2019-11-20 | Disposition: A | Discharge status: Home / Self Care | Payer: Medicare Other | Attending: Emergency Medicine | Admitting: Emergency Medicine

## 2019-11-20 ENCOUNTER — Emergency Department (HOSPITAL_COMMUNITY): Payer: Medicare Other

## 2019-11-20 DIAGNOSIS — E039 Hypothyroidism, unspecified: Secondary | ICD-10-CM | POA: Insufficient documentation

## 2019-11-20 DIAGNOSIS — J45909 Unspecified asthma, uncomplicated: Secondary | ICD-10-CM | POA: Insufficient documentation

## 2019-11-20 DIAGNOSIS — R072 Precordial pain: Secondary | ICD-10-CM | POA: Diagnosis not present

## 2019-11-20 DIAGNOSIS — Z79899 Other long term (current) drug therapy: Secondary | ICD-10-CM | POA: Diagnosis not present

## 2019-11-20 DIAGNOSIS — R0789 Other chest pain: Secondary | ICD-10-CM | POA: Diagnosis present

## 2019-11-20 LAB — CBC
HCT: 41.3 % (ref 36.0–46.0)
Hemoglobin: 12.9 g/dL (ref 12.0–15.0)
MCH: 26.2 pg (ref 26.0–34.0)
MCHC: 31.2 g/dL (ref 30.0–36.0)
MCV: 83.9 fL (ref 80.0–100.0)
Platelets: 282 10*3/uL (ref 150–400)
RBC: 4.92 MIL/uL (ref 3.87–5.11)
RDW: 12.6 % (ref 11.5–15.5)
WBC: 8.1 10*3/uL (ref 4.0–10.5)
nRBC: 0 % (ref 0.0–0.2)

## 2019-11-20 LAB — I-STAT BETA HCG BLOOD, ED (MC, WL, AP ONLY): I-stat hCG, quantitative: <5 m[IU]/mL (ref <5)

## 2019-11-20 LAB — TROPONIN I (HIGH SENSITIVITY)
Troponin I (High Sensitivity): 3 ng/L (ref <18)
Troponin I (High Sensitivity): 4 ng/L (ref <18)

## 2019-11-20 LAB — BASIC METABOLIC PANEL
Anion gap: 15 (ref 5–15)
BUN: 9 mg/dL (ref 6–20)
CO2: 23 mmol/L (ref 22–32)
Calcium: 9.7 mg/dL (ref 8.9–10.3)
Chloride: 103 mmol/L (ref 98–111)
Creatinine, Ser: 0.85 mg/dL (ref 0.44–1.00)
GFR calc Af Amer: >60 mL/min (ref >60)
GFR calc non Af Amer: >60 mL/min (ref >60)
Glucose, Bld: 94 mg/dL (ref 70–99)
Potassium: 3.2 mmol/L — ABNORMAL LOW (ref 3.5–5.1)
Sodium: 141 mmol/L (ref 135–145)

## 2019-11-20 MED ORDER — SODIUM CHLORIDE 0.9% FLUSH: 3.0000 mL | Freq: Once | INTRAVENOUS | Status: DC

## 2019-11-20 MED ORDER — ASPIRIN 81 MG PO CHEW
324.0000 mg | CHEWABLE_TABLET | Freq: Once | ORAL | Status: AC
  Administered 2019-11-20: 17:00:00 324 mg via ORAL
  Filled 2019-11-20: qty 4

## 2019-11-20 MED ORDER — NITROGLYCERIN 0.4 MG SL SUBL: 0.4000 mg | SUBLINGUAL_TABLET | SUBLINGUAL | Status: DC | PRN

## 2019-11-20 NOTE — ED Provider Notes (Signed)
[facility] EMERGENC[facility] DEPARTMENT Provider Note   CSN: [id number] Arrival date & time: 11/20/19  1522     History Chief Complaint  Patient presents with  . Chest Pain    Joy Cook is a 54 y.o. female.  HPI   54 year old female with a past medical history of asthma, GERD, hypothyroidism, HTN, hypercholesterolemia presenting to the emerge department complaining of sternal pressure-like chest pain radiating to both the left and right chest as well as the left neck with associated shortness of breath and hot flash that is waxing and waning in nature.  Pain began around 1300.  Patient reports that the pain also radiated to her back.  Patient noted at the time that she felt fatigued and generalized weakness.  No treatments tried.  No recent surgeries, long trips, lower extremity swelling or pain, long flights, previous blood clots, history of cancer, estrogen therapy. Pain began at rest.  Patient does have a FHx of CAD on her dad's side.   Past Medical History:  Diagnosis Date  . Asthma   . GERD (gastroesophageal reflux disease)   . Hypothyroidism   . Thyroid disease     Patient Active Problem List   Diagnosis Date Noted  . Palpitations 10/16/2016  . Dizziness 10/16/2016  . Chest pain 10/16/2016  . Diverticulitis large intestine 09/24/2016  . Intractable abdominal pain 09/24/2016  . Chronic pain syndrome 09/24/2016  . GERD (gastroesophageal reflux disease) 09/24/2016  . Asthma 09/24/2016  . Hypothyroidism 09/24/2016  . Hypokalemia 09/24/2016    Past Surgical History:  Procedure Laterality Date  . BREAST REDUCTION SURGERY    . BREAST SURGERY    . BREAST SURGERY  LUMP REMOVED   1988  . CESAREAN SECTION    . CESAREAN SECTION     X3  . COSMETIC SURGERY    . DILATATION & CURRETTAGE/HYSTEROSCOPY WITH RESECTOCOPE N/A 05/03/2014   Procedure: [place];  Surgeon: Thurnell Lose, MD;  Location: [facility];  Service:  Gynecology;  Laterality: N/A;  . HERNIA REPAIR    . KNEE ARTHROSCOPY     LEFT AND RIGHT  . REDUCTION MAMMAPLASTY Bilateral    1998  . SHOULDER ARTHROSCOPY    . SHOULDER SURGERY Left   . tummy tuck  2010     OB History    Gravida  0   Para  0   Term  0   Preterm  0   AB  0   Living        SAB  0   TAB  0   Ectopic  0   Multiple      Live Births              Family History  Problem Relation Age of Onset  . Hypertension Other     Social History   Tobacco Use  . Smoking status: Never Smoker  . Smokeless tobacco: Never Used  Substance Use Topics  . Alcohol use: No  . Drug use: No    Home Medications Prior to Admission medications   Medication Sig Start Date End Date Taking? Authorizing Provider  Albuterol Sulfate (PROAIR RESPICLICK) 123XX123 (90 Base) MCG/ACT AEPB Inhale 2 puffs into the lungs every 6 (six) hours as needed (shortness of breath/wheezing).   Yes [provider]  amLODipine (NORVASC) 5 MG tablet Take 5 mg by mouth daily.  02/19/15  Yes [provider]  ARIPiprazole (ABILIFY) 30 MG tablet Take 30 mg by mouth  daily. 11/14/19  Yes [provider]  atorvastatin (LIPITOR) 20 MG tablet Take 20 mg by mouth daily. 10/22/19  Yes [provider]  cetirizine (ZYRTEC ALLERGY) 10 MG tablet Take 10 mg by mouth daily.    Yes [provider]  Cholecalciferol (VITAMIN D) 50 MCG (2000 UT) tablet Take 2,000 Units by mouth daily.    Yes [provider]  clonazePAM (KLONOPIN) 0.5 MG tablet Take 0.5 mg by mouth at bedtime as needed (sleep).  09/05/16  Yes [provider]  Ferrous Sulfate 27 MG TABS Take 27 mg by mouth daily.    Yes [provider]  fluticasone (FLONASE) 50 MCG/ACT nasal spray Place 1 spray into both nostrils daily at 12 noon.    Yes [provider]  fluticasone (FLOVENT HFA) 110 MCG/ACT inhaler Inhale 1 puff into the lungs 2 (two) times daily.    Yes [provider]    gabapentin (NEURONTIN) 600 MG tablet Take 600 mg by mouth See admin instructions. Take one tablet (600 mg) every morning, take two tablets (1200 mg) every afternoon and at bedtime   Yes [provider]  hydrochlorothiazide (HYDRODIURIL) 25 MG tablet Take 25 mg by mouth daily.  08/18/16  Yes [provider]  levalbuterol (XOPENEX) 1.25 MG/3ML nebulizer solution Take 1.25 mg by nebulization every 4 (four) hours as needed for wheezing.   Yes [provider]  levothyroxine (SYNTHROID, LEVOTHROID) 100 MCG tablet Take 100 mcg by mouth daily before breakfast.   Yes [provider]  magnesium oxide (MAG-OX) 400 MG tablet Take 800 mg by mouth daily.   Yes [provider]  OVER THE COUNTER MEDICATION Apply 1 application topically See admin instructions. CRAMER ANALGESIC CREAM - apply topically 5 times daily prn pain   Yes [provider]  pantoprazole (PROTONIX) 40 MG tablet Take 40 mg by mouth at bedtime. 11/03/19  Yes [provider]  perphenazine (TRILAFON) 8 MG tablet Take 4-8 mg by mouth See admin instructions. Take one tablet (8 mg) by mouth twice daily, take 1/2 tablet (4 mg) at bedtime 07/17/17  Yes [provider]  traMADol (ULTRAM) 50 MG tablet Take 50 mg by mouth 2 (two) times daily as needed (pain).    Yes [provider]  triamcinolone cream (KENALOG) 0.1 % Apply 1 application topically 2 (two) times daily as needed (itching).   Yes [provider]  fluconazole (DIFLUCAN) 150 MG tablet Take 150 mg by mouth See admin instructions. Take one tablet (150 mg) by mouth now, then take one tablet 2 days later if still needed for thrush. 11/08/19   [provider]    Allergies    Shellfish allergy, Codeine, Hydrocodone, Tandem plus [[id number]-fepo-fa-b [id number]], and Tandem [ferrous fum-iron polysacch]  Review of Systems   Review of Systems  Constitutional: Positive for fatigue. Negative for activity change,  appetite change, chills, diaphoresis and fever.  HENT: Negative for congestion and rhinorrhea.   Respiratory: Positive for shortness of breath. Negative for cough and wheezing.   Cardiovascular: Positive for chest pain.  Gastrointestinal: Negative for abdominal distention, abdominal pain, diarrhea, nausea and vomiting.  Genitourinary: Negative for decreased urine volume, difficulty urinating, dysuria, frequency and urgency.  Musculoskeletal: Positive for back pain and neck pain. Negative for gait problem.  Skin: Negative for color change and wound.  Neurological: Negative for dizziness, syncope, weakness, light-headedness and headaches.  All other systems reviewed and are negative.   Physical Exam Updated Vital Signs BP (!) 141/86  Pulse 73   Temp 98.7 F (37.1 C) (Oral)   Resp 20   Ht 5\' 2"  (1.575 m)   Wt 73 kg   LMP  (LMP Unknown) Comment: tubes tied  SpO2 99%   BMI 29.44 kg/m   Physical Exam Vitals and nursing note reviewed.  Constitutional:      General: She is not in acute distress.    Appearance: Normal appearance. She is normal weight. She is not ill-appearing.  HENT:     Head: Normocephalic.     Right Ear: External ear normal.     Left Ear: External ear normal.     Nose: Nose normal.     Mouth/Throat:     Mouth: Mucous membranes are moist.     Pharynx: Oropharynx is clear.  Eyes:     Extraocular Movements: Extraocular movements intact.  Cardiovascular:     Rate and Rhythm: Normal rate and regular rhythm.     Pulses: Normal pulses.     Heart sounds: Normal heart sounds.  Pulmonary:     Effort: Pulmonary effort is normal. No respiratory distress.     Breath sounds: Normal breath sounds. No wheezing or rhonchi.  Abdominal:     General: Bowel sounds are normal.     Palpations: Abdomen is soft.     Tenderness: There is no abdominal tenderness. There is no guarding.  Musculoskeletal:     Cervical back: Normal range of motion.     Right lower leg: No edema.      Left lower leg: No edema.  Skin:    General: Skin is warm and dry.     Capillary Refill: Capillary refill takes less than 2 seconds.  Neurological:     General: No focal deficit present.     Mental Status: She is alert and oriented to person, place, and time. Mental status is at baseline.  Psychiatric:        Mood and Affect: Mood normal.     ED Results / Procedures / Treatments   Labs (all labs ordered are listed, but only abnormal results are displayed) Labs Reviewed  BASIC METABOLIC PANEL - Abnormal; Notable for the following components:      Result Value   Potassium 3.2 (*)    All other components within normal limits  CBC  I-STAT BETA HCG BLOOD, ED ([facility], [facility], [facility] ONLY)  TROPONIN I (HIGH SENSITIVITY)  TROPONIN I (HIGH SENSITIVITY)    EKG EKG Interpretation  Date/Time:  Sunday November 20 2019 15:31:01 EDT Ventricular Rate:  86 PR Interval:  130 QRS Duration: 82 QT Interval:  358 QTC Calculation: 428 R Axis:   -34 Text Interpretation: Normal sinus rhythm Left axis deviation Abnormal ECG no ischemic changes Confirmed by Theotis Burrow [phone]) on 11/20/2019 5:25:16 PM   Radiology DG Chest 2 View  Result Date: 11/20/2019 CLINICAL DATA:  54 year old female with chest pain. EXAM: CHEST - 2 VIEW COMPARISON:  Chest radiograph dated 09/10/2016 FINDINGS: The lungs are clear. There is no pleural effusion or pneumothorax. The cardiac silhouette is within limits the acute pathology. IMPRESSION: No active cardiopulmonary disease. Electronically Signed   By: Anner Crete M.D.   On: 11/20/2019 16:26    Procedures Procedures (including critical care time)  Medications Ordered in ED Medications  sodium chloride flush (NS) 0.9 % injection 3 mL (has no administration in time range)  nitroGLYCERIN (NITROSTAT) SL tablet 0.4 mg (has no administration in time range)  aspirin chewable tablet 324 mg (324 mg Oral  Given 11/20/19 1642)    ED Course  I have reviewed the triage vital signs  and the nursing notes.  Pertinent labs & imaging results that were available during my care of the patient were reviewed by me and considered in my medical decision making (see chart for details).    MDM Rules/Calculators/A&P                      Upon my evaluation patient patient endorses typical chest pain today.Patient did not receive 325 mg ASA prior to arrival therefore I did provide ASA.  Symptomatic treatment with nitroglycerin was not required. I explicitly expressed my concerns, DDX, and MDM at the patient's bedside.    ECG interpreted by me demonstrated Normal sinus rhythm at 86 bpm, left axis deviation, LAFB, normal intervals, no ST or T wave changes suggestive of acute ischemia, unchanged EKG compared to previous on 09/10/2016 or previous EKG earlier today at 1530  EKG I obtained reveals no anatomical ischemia representing STEMI, New-onset Arrhythmia, or ischemic equivalent. Therefore do not suspect ACS at this time. No concerns for Pericardial Tamponade on EKG and in light of patients hemodynamic stability. No pain related to supine or prone positions and given EKG doubt Pericarditis.   Per the patient's absence of CAD will send Troponin. First of which is negative, Repeat troponin at 2 hour interval was also negative. Therefore also do not suspect Myocarditis.   CXR unremarkable for focal airspace disease, patient is afebrile, cough, and WBC shows no leukocytosis, do not suspect Pneumonia. Without evidence of Pneumothorax. CXR without concern for Esophageal Tear and there is no recent intractable emesis or esophageal instrumentation. No peritonitis or free air on CXR worrisome for Perforated Abdominal Viscous.  Unlikely Pulmonary Embolism as patient denies estrogen supplementation. Denies malignancy with treatment in last 6 months.No previous Hx of DVT/PE. Patient denies hemoptysis. No unilateral leg swelling observed on exam. Oxygenation saturation has been maintained >95% & HR has  been <100 since since arrival to the ED. No recent surgery or trauma to lower extremities or travel involving prolonged car or plane ride. Therefore will not obtain CTA Chest or D-dimer.  Pain is not described as tearing but does radiate to back, doubt Aortic Dissection with resolution of the pain without intervention. Pulses present bilaterally in upper and lower extremities. CXR does not show widened mediastinum.  My H&P and patient's clinical course is not currently consistent with any of the above emergency medical conditions at this time.   Patient was appropriately risk stratified as HEAR score of 3 and will not require continued observation. Ambulatory referral to cardiology placed for close follow up.  First of serial Troponin is 3, delta 4 and ECG upon review shows no evidence of active ischemia or infarction.  I feel that the patient is safe and stable for discharge this time with a cardiology referral, strict return precautions and plan for follow-up care in place as needed  The plan for this patient was discussed with Dr. Rex Kras, who voiced agreement and who oversaw evaluation and treatment of this patient.   Final Clinical Impression(s) / ED Diagnoses Final diagnoses:  Precordial pain    Rx / DC Orders ED Discharge Orders         Ordered    Ambulatory referral to Cardiology    Comments: For consideration of cardiac stress testing   11/20/19 1847           Filbert Berthold, MD 11/20/19 1945  Little, Wenda Overland, MD 11/24/19 [phone]

## 2019-11-20 NOTE — ED Triage Notes (Signed)
Pt c/o central CP radiating to back x 1-2 hours, denies n/v/d, denies shob.

## 2019-11-22 ENCOUNTER — Ambulatory Visit: Payer: Medicare Other | Admitting: Cardiology

## 2020-01-05 ENCOUNTER — Other Ambulatory Visit: Payer: Self-pay

## 2020-01-05 ENCOUNTER — Ambulatory Visit
Admission: RE | Admit: 2020-01-05 | Discharge: 2020-01-05 | Disposition: A | Discharge status: Home / Self Care | Payer: Medicare Other | Source: Ambulatory Visit | Attending: Gastroenterology | Admitting: Gastroenterology

## 2020-01-05 ENCOUNTER — Other Ambulatory Visit: Payer: Self-pay | Admitting: Gastroenterology

## 2020-01-05 DIAGNOSIS — R109 Unspecified abdominal pain: Secondary | ICD-10-CM

## 2020-01-05 MED ORDER — IOPAMIDOL (ISOVUE-300) INJECTION 61%
100.0000 mL | Freq: Once | INTRAVENOUS | Status: AC | PRN
  Administered 2020-01-05: 100 mL via INTRAVENOUS

## 2020-01-19 ENCOUNTER — Other Ambulatory Visit: Payer: Self-pay | Admitting: Student

## 2020-01-19 DIAGNOSIS — K610 Anal abscess: Secondary | ICD-10-CM

## 2020-02-07 ENCOUNTER — Other Ambulatory Visit: Payer: Self-pay | Admitting: Family Medicine

## 2020-02-07 DIAGNOSIS — Z1231 Encounter for screening mammogram for malignant neoplasm of breast: Secondary | ICD-10-CM

## 2020-02-28 ENCOUNTER — Ambulatory Visit
Admission: RE | Admit: 2020-02-28 | Discharge: 2020-02-28 | Disposition: A | Discharge status: Home / Self Care | Payer: Medicare Other | Source: Ambulatory Visit | Attending: Student | Admitting: Student

## 2020-02-28 ENCOUNTER — Other Ambulatory Visit: Payer: Self-pay

## 2020-02-28 DIAGNOSIS — K610 Anal abscess: Secondary | ICD-10-CM

## 2020-02-28 MED ORDER — GADOBENATE DIMEGLUMINE 529 MG/ML IV SOLN
14.0000 mL | Freq: Once | INTRAVENOUS | Status: AC | PRN
  Administered 2020-02-28: 14 mL via INTRAVENOUS

## 2020-03-12 ENCOUNTER — Other Ambulatory Visit: Payer: Self-pay

## 2020-03-12 ENCOUNTER — Ambulatory Visit
Admission: RE | Admit: 2020-03-12 | Discharge: 2020-03-12 | Disposition: A | Discharge status: Home / Self Care | Payer: Medicare Other | Source: Ambulatory Visit | Attending: Family Medicine | Admitting: Family Medicine

## 2020-03-12 DIAGNOSIS — Z1231 Encounter for screening mammogram for malignant neoplasm of breast: Secondary | ICD-10-CM

## 2020-04-05 ENCOUNTER — Encounter: Payer: Self-pay | Admitting: Cardiology

## 2020-04-05 ENCOUNTER — Ambulatory Visit (INDEPENDENT_AMBULATORY_CARE_PROVIDER_SITE_OTHER): Payer: Medicare Other | Admitting: Cardiology

## 2020-04-05 VITALS — BP 118/78 | HR 87 | Ht 62.0 in | Wt 151.4 lb

## 2020-04-05 DIAGNOSIS — E782 Mixed hyperlipidemia: Secondary | ICD-10-CM

## 2020-04-05 DIAGNOSIS — I209 Angina pectoris, unspecified: Secondary | ICD-10-CM | POA: Diagnosis not present

## 2020-04-05 DIAGNOSIS — R079 Chest pain, unspecified: Secondary | ICD-10-CM

## 2020-04-05 DIAGNOSIS — I10 Essential (primary) hypertension: Secondary | ICD-10-CM

## 2020-04-05 DIAGNOSIS — R42 Dizziness and giddiness: Secondary | ICD-10-CM

## 2020-04-05 DIAGNOSIS — R072 Precordial pain: Secondary | ICD-10-CM

## 2020-04-05 DIAGNOSIS — R931 Abnormal findings on diagnostic imaging of heart and coronary circulation: Secondary | ICD-10-CM

## 2020-04-05 DIAGNOSIS — R002 Palpitations: Secondary | ICD-10-CM

## 2020-04-05 DIAGNOSIS — Z8249 Family history of ischemic heart disease and other diseases of the circulatory system: Secondary | ICD-10-CM | POA: Diagnosis not present

## 2020-04-05 MED ORDER — METOPROLOL TARTRATE 100 MG PO TABS: 100.0000 mg | ORAL_TABLET | Freq: Once | ORAL | 0 refills | Status: DC

## 2020-04-05 NOTE — Progress Notes (Signed)
Cardiology Office Note:    Date:  04/05/2020   ID:  Joy Cook, DOB 06/26/1966, MRN [id number]  PCP:  Leighton Ruff, MD  [place] Cardiologist:  No primary care provider on file.  [facility] Electrophysiologist:  None   Referring MD: Sharlett Iles, *   Reason for visit: ER follow-up, chest pain  History of Present Illness:    Joy Cook is a 54 y.o. female with a hx of asthma, GERD who is coming after being sent to the ER for chest pain.  It happened while patient was at rest, it was associated with palpitations but no other symptoms, resulting ER, her EKG while in the ER showed normal sinus rhythm with left axis deviation otherwise normal EKG, her labs were all normal and 2 consecutive troponins were normal. Today she states that she is having on and off chest pain at rest sometimes positional, not exertional, she has significant family history of premature coronary artery disease and 2 of her grandparents who died of massive heart attacks.  She denies any lower extremity edema orthopnea proximal nocturnal dyspnea.  She is tolerating atorvastatin well.   Past Medical History:  Diagnosis Date  . Asthma   . GERD (gastroesophageal reflux disease)   . Hypothyroidism   . Thyroid disease     Past Surgical History:  Procedure Laterality Date  . BREAST REDUCTION SURGERY    . BREAST SURGERY    . BREAST SURGERY  LUMP REMOVED   1988  . CESAREAN SECTION    . CESAREAN SECTION     X3  . COSMETIC SURGERY    . DILATATION & CURRETTAGE/HYSTEROSCOPY WITH RESECTOCOPE N/A 05/03/2014   Procedure: [place];  Surgeon: Thurnell Lose, MD;  Location: [facility];  Service: Gynecology;  Laterality: N/A;  . HERNIA REPAIR    . KNEE ARTHROSCOPY     LEFT AND RIGHT  . REDUCTION MAMMAPLASTY Bilateral    1998  . SHOULDER ARTHROSCOPY    . SHOULDER SURGERY Left   . tummy tuck  2010    Current Medications: Current Meds  Medication  Sig  . Albuterol Sulfate (PROAIR RESPICLICK) 967 (90 Base) MCG/ACT AEPB Inhale 2 puffs into the lungs every 6 (six) hours as needed (shortness of breath/wheezing).  Marland Kitchen amLODipine (NORVASC) 5 MG tablet Take 5 mg by mouth daily.   . ARIPiprazole (ABILIFY) 30 MG tablet Take 30 mg by mouth daily.  Marland Kitchen atorvastatin (LIPITOR) 20 MG tablet Take 20 mg by mouth daily.  . cetirizine (ZYRTEC ALLERGY) 10 MG tablet Take 10 mg by mouth daily.   . Cholecalciferol (VITAMIN D) 50 MCG (2000 UT) tablet Take 2,000 Units by mouth daily.   . clonazePAM (KLONOPIN) 0.5 MG tablet Take 0.5 mg by mouth at bedtime as needed (sleep).   . Ferrous Sulfate 27 MG TABS Take 27 mg by mouth daily.   . fluticasone (FLONASE) 50 MCG/ACT nasal spray Place 1 spray into both nostrils daily at 12 noon.   . fluticasone (FLOVENT HFA) 110 MCG/ACT inhaler Inhale 1 puff into the lungs 2 (two) times daily.   . hydrochlorothiazide (HYDRODIURIL) 25 MG tablet Take 25 mg by mouth daily.   Marland Kitchen levalbuterol (XOPENEX) 1.25 MG/3ML nebulizer solution Take 1.25 mg by nebulization every 4 (four) hours as needed for wheezing.  Marland Kitchen levothyroxine (SYNTHROID, LEVOTHROID) 100 MCG tablet Take 100 mcg by mouth daily before breakfast.  . OVER THE COUNTER MEDICATION Apply 1 application topically See admin instructions. CRAMER ANALGESIC  CREAM - apply topically 5 times daily prn pain  . pantoprazole (PROTONIX) 40 MG tablet Take 40 mg by mouth at bedtime.  Marland Kitchen perphenazine (TRILAFON) 8 MG tablet Take 4-8 mg by mouth See admin instructions. Take one tablet (8 mg) by mouth twice daily, take 1/2 tablet (4 mg) at bedtime  . traMADol (ULTRAM) 50 MG tablet Take 50 mg by mouth 2 (two) times daily as needed (pain).   . triamcinolone cream (KENALOG) 0.1 % Apply 1 application topically 2 (two) times daily as needed (itching).     Allergies:   Shellfish allergy, Codeine, Hydrocodone, Tandem plus [fefum-fepo-fa-b [id number]], and Tandem [ferrous fum-iron polysacch]   Social  History   Socioeconomic History  . Marital status: Married    Spouse name: Not on file  . Number of children: Not on file  . Years of education: Not on file  . Highest education level: Not on file  Occupational History  . Not on file  Tobacco Use  . Smoking status: Never Smoker  . Smokeless tobacco: Never Used  Substance and Sexual Activity  . Alcohol use: No  . Drug use: No  . Sexual activity: Yes  Other Topics Concern  . Not on file  Social History Narrative   ** Merged History Encounter **       Social Determinants of Health   Financial Resource Strain:   . Difficulty of Paying Living Expenses: Not on file  Food Insecurity:   . Worried About Charity fundraiser in the Last Year: Not on file  . Ran Out of Food in the Last Year: Not on file  Transportation Needs:   . Lack of Transportation (Medical): Not on file  . Lack of Transportation (Non-Medical): Not on file  Physical Activity:   . Days of Exercise per Week: Not on file  . Minutes of Exercise per Session: Not on file  Stress:   . Feeling of Stress : Not on file  Social Connections:   . Frequency of Communication with Friends and Family: Not on file  . Frequency of Social Gatherings with Friends and Family: Not on file  . Attends Religious Services: Not on file  . Active Member of Clubs or Organizations: Not on file  . Attends Archivist Meetings: Not on file  . Marital Status: Not on file     Family History: The patient's family history includes Hypertension in an other family member.  ROS:   Please see the history of present illness.    All other systems reviewed and are negative.  EKGs/Labs/Other Studies Reviewed:    The following studies were reviewed today:  EKG:  EKG is ordered today.  The ekg ordered today demonstrates normal sinus rhythm, left axis deviation, unchanged from prior.  This was personally  Recent Labs: 11/20/2019: BUN 9; Creatinine, Ser 0.85; Hemoglobin 12.9; Platelets  282; Potassium 3.2; Sodium 141  Recent Lipid Panel No results found for: CHOL, TRIG, HDL, CHOLHDL, VLDL, LDLCALC, LDLDIRECT  Physical Exam:    VS:  BP 118/78   Pulse 87   Ht 5' 2"  (1.575 m)   Wt 151 lb 6.4 oz (68.7 kg)   LMP  (LMP Unknown) Comment: tubes tied  SpO2 98%   BMI 27.69 kg/m     Wt Readings from Last 3 Encounters:  04/05/20 151 lb 6.4 oz (68.7 kg)  11/20/19 160 lb 15 oz (73 kg)  10/16/16 172 lb (78 kg)     GEN:  Well nourished, well  developed in no acute distress HEENT: Normal NECK: No JVD; No carotid bruits LYMPHATICS: No lymphadenopathy CARDIAC: RRR, no murmurs, rubs, gallops RESPIRATORY:  Clear to auscultation without rales, wheezing or rhonchi  ABDOMEN: Soft, non-tender, non-distended MUSCULOSKELETAL:  No edema; No deformity  SKIN: Warm and dry NEUROLOGIC:  Alert and oriented x 3 PSYCHIATRIC:  Normal affect   ASSESSMENT:    1. Chest pain of uncertain etiology   2. Mixed hyperlipidemia   3. Family history of early CAD   30. Essential hypertension   5. Precordial pain   6. Angina pectoris ([place])   7. Abnormal findings on diagnostic imaging of heart and coronary circulation    8. Palpitations   9. Dizziness      PLAN:    In order of problems listed above:  Chest pain -Atypical, however significant family of premature coronary artery disease also risk factors including hypertension hyperlipidemia, will proceed with coronary CTA.  We will also obtain an echocardiogram since she never had any.  Hypertension -Controlled  Hyperlipidemia -She is tolerating moderate dose of atorvastatin 20 mg daily, lipids at goal with triglycerides 64, LDL 71 and HDL 57 in March 2021.  Medication Adjustments/Labs and Tests Ordered: Current medicines are reviewed at length with the patient today.  Concerns regarding medicines are outlined above.  Orders Placed This Encounter  Procedures  . CT CORONARY MORPH W/CTA COR W/SCORE W/CA W/CM &/OR WO/CM  . CT CORONARY  FRACTIONAL FLOW RESERVE DATA PREP  . CT CORONARY FRACTIONAL FLOW RESERVE FLUID ANALYSIS  . Basic metabolic panel  . EKG 12-Lead  . ECHOCARDIOGRAM COMPLETE   Meds ordered this encounter  Medications  . metoprolol tartrate (LOPRESSOR) 100 MG tablet    Sig: Take 1 tablet (100 mg total) by mouth once for 1 dose. Take 2 hours prior to your Coronary CT.    Dispense:  1 tablet    Refill:  0    Patient Instructions  Medication Instructions:   Your physician recommends that you continue on your current medications as directed. Please refer to the Current Medication list given to you today.  *If you need a refill on your cardiac medications before your next appointment, please call your pharmacy*  Testing/Procedures:  Your physician has requested that you have an echocardiogram. Echocardiography is a painless test that uses sound waves to create images of your heart. It provides your doctor with information about the size and shape of your heart and how well your heart's chambers and valves are working. This procedure takes approximately one hour. There are no restrictions for this procedure.  Your cardiac CT will be scheduled at one of the below locations:   [facility] [address] [place], [place] [address] [phone]  If scheduled at [facility], please arrive at the [facility] main entrance of [facility] 30 minutes prior to test start time. Proceed to the [facility] Radiology Department (first floor) to check-in and test prep.  Please follow these instructions carefully (unless otherwise directed):  On the Night Before the Test: . Be sure to Drink plenty of water. . Do not consume any caffeinated/decaffeinated beverages or chocolate 12 hours prior to your test. . Do not take any antihistamines 12 hours prior to your test.  On the Day of the Test: . Drink plenty of water. Do not drink any water within one hour of the test. . Do not eat any  food 4 hours prior to the test. . You may  take your regular medications prior to the test.  . Take metoprolol 100 mg (Lopressor) two hours prior to test. . HOLD Hydrochlorothiazide morning of the test. . FEMALES- please wear underwire-free bra if available      After the Test: . Drink plenty of water. . After receiving IV contrast, you may experience a mild flushed feeling. This is normal. . On occasion, you may experience a mild rash up to 24 hours after the test. This is not dangerous. If this occurs, you can take Benadryl 25 mg and increase your fluid intake. . If you experience trouble breathing, this can be serious. If it is severe call 911 IMMEDIATELY. If it is mild, please call our office.   Once we have confirmed authorization from your insurance company, we will call you to set up a date and time for your test. Based on how quickly your insurance processes prior authorizations requests, please allow up to 4 weeks to be contacted for scheduling your Cardiac CT appointment. Be advised that routine Cardiac CT appointments could be scheduled as many as 8 weeks after your provider has ordered it.  For non-scheduling related questions, please contact the cardiac imaging nurse navigator should you have any questions/concerns: Marchia Bond, Cardiac Imaging Nurse Navigator Burley Saver, Interim Cardiac Imaging Nurse [place] [facility] Services Direct Office Dial: [phone]   For scheduling needs, including cancellations and rescheduling, please call Vivien Rota at [phone], option 3.   Follow-Up:  4 MONTHS OR AROUND THAT TIME WITH DR. Meda Coffee IN THE OFFICE       Signed, Ena Dawley, MD  04/05/2020 3:02 PM    [facility]

## 2020-04-05 NOTE — Patient Instructions (Signed)
Medication Instructions:   Your physician recommends that you continue on your current medications as directed. Please refer to the Current Medication list given to you today.  *If you need a refill on your cardiac medications before your next appointment, please call your pharmacy*  Testing/Procedures:  Your physician has requested that you have an echocardiogram. Echocardiography is a painless test that uses sound waves to create images of your heart. It provides your doctor with information about the size and shape of your heart and how well your heart's chambers and valves are working. This procedure takes approximately one hour. There are no restrictions for this procedure.  Your cardiac CT will be scheduled at one of the below locations:   [facility] [address] [place], [place] [address] [phone]  If scheduled at [facility], please arrive at the [facility] main entrance of [facility] 30 minutes prior to test start time. Proceed to the [facility] Radiology Department (first floor) to check-in and test prep.  Please follow these instructions carefully (unless otherwise directed):  On the Night Before the Test: . Be sure to Drink plenty of water. . Do not consume any caffeinated/decaffeinated beverages or chocolate 12 hours prior to your test. . Do not take any antihistamines 12 hours prior to your test.  On the Day of the Test: . Drink plenty of water. Do not drink any water within one hour of the test. . Do not eat any food 4 hours prior to the test. . You may take your regular medications prior to the test.  . Take metoprolol 100 mg (Lopressor) two hours prior to test. . HOLD Hydrochlorothiazide morning of the test. . FEMALES- please wear underwire-free bra if available      After the Test: . Drink plenty of water. . After receiving IV contrast, you may experience a mild flushed feeling. This is normal. . On occasion, you may  experience a mild rash up to 24 hours after the test. This is not dangerous. If this occurs, you can take Benadryl 25 mg and increase your fluid intake. . If you experience trouble breathing, this can be serious. If it is severe call 911 IMMEDIATELY. If it is mild, please call our office.   Once we have confirmed authorization from your insurance company, we will call you to set up a date and time for your test. Based on how quickly your insurance processes prior authorizations requests, please allow up to 4 weeks to be contacted for scheduling your Cardiac CT appointment. Be advised that routine Cardiac CT appointments could be scheduled as many as 8 weeks after your provider has ordered it.  For non-scheduling related questions, please contact the cardiac imaging nurse navigator should you have any questions/concerns: Marchia Bond, Cardiac Imaging Nurse Navigator Burley Saver, Interim Cardiac Imaging Nurse [place] and V[facility] Services Direct Office Dial: [phone]   For scheduling needs, including cancellations and rescheduling, please call Vivien Rota at [phone], option 3.   Follow-Up:  4 MONTHS OR AROUND THAT TIME WITH DR. Meda Coffee IN THE OFFICE

## 2020-04-18 ENCOUNTER — Telehealth (HOSPITAL_COMMUNITY): Payer: Self-pay | Admitting: Emergency Medicine

## 2020-04-18 DIAGNOSIS — M24131 Other articular cartilage disorders, right wrist: Secondary | ICD-10-CM | POA: Insufficient documentation

## 2020-04-18 NOTE — Telephone Encounter (Signed)
Reaching out to patient to offer assistance regarding upcoming cardiac imaging study; pt verbalizes understanding of appt date/time, parking situation and where to check in, pre-test NPO status and medications ordered, and verified current allergies; name and call back number provided for further questions should they arise Thayer Inabinet RN Navigator Cardiac Imaging [facility] [phone] office [phone] cell 

## 2020-04-19 ENCOUNTER — Other Ambulatory Visit: Payer: Self-pay

## 2020-04-19 ENCOUNTER — Ambulatory Visit (HOSPITAL_COMMUNITY)
Admission: RE | Admit: 2020-04-19 | Discharge: 2020-04-19 | Disposition: A | Discharge status: Home / Self Care | Payer: Medicare Other | Source: Ambulatory Visit | Attending: Cardiology | Admitting: Cardiology

## 2020-04-19 DIAGNOSIS — R931 Abnormal findings on diagnostic imaging of heart and coronary circulation: Secondary | ICD-10-CM | POA: Insufficient documentation

## 2020-04-19 DIAGNOSIS — R42 Dizziness and giddiness: Secondary | ICD-10-CM | POA: Diagnosis present

## 2020-04-19 DIAGNOSIS — R072 Precordial pain: Secondary | ICD-10-CM | POA: Diagnosis present

## 2020-04-19 DIAGNOSIS — R079 Chest pain, unspecified: Secondary | ICD-10-CM | POA: Insufficient documentation

## 2020-04-19 DIAGNOSIS — I209 Angina pectoris, unspecified: Secondary | ICD-10-CM | POA: Insufficient documentation

## 2020-04-19 DIAGNOSIS — R002 Palpitations: Secondary | ICD-10-CM | POA: Insufficient documentation

## 2020-04-19 DIAGNOSIS — Z8249 Family history of ischemic heart disease and other diseases of the circulatory system: Secondary | ICD-10-CM | POA: Diagnosis present

## 2020-04-19 DIAGNOSIS — E782 Mixed hyperlipidemia: Secondary | ICD-10-CM | POA: Insufficient documentation

## 2020-04-19 DIAGNOSIS — I10 Essential (primary) hypertension: Secondary | ICD-10-CM | POA: Insufficient documentation

## 2020-04-19 MED ORDER — NITROGLYCERIN 0.4 MG SL SUBL
SUBLINGUAL_TABLET | SUBLINGUAL | Status: AC
  Filled 2020-04-19: qty 2

## 2020-04-19 MED ORDER — IOHEXOL 350 MG/ML SOLN
80.0000 mL | Freq: Once | INTRAVENOUS | Status: AC | PRN
  Administered 2020-04-19: 80 mL via INTRAVENOUS

## 2020-04-19 MED ORDER — NITROGLYCERIN 0.4 MG SL SUBL
0.8000 mg | SUBLINGUAL_TABLET | Freq: Once | SUBLINGUAL | Status: AC
  Administered 2020-04-19: 0.8 mg via SUBLINGUAL

## 2020-04-19 NOTE — Progress Notes (Signed)
CT scan completed. Tolerated well. D/c home ambulatory, awake and alert. In no distress 

## 2020-04-23 ENCOUNTER — Telehealth: Payer: Self-pay | Admitting: *Deleted

## 2020-04-23 MED ORDER — ROSUVASTATIN CALCIUM 5 MG PO TABS: 5.0000 mg | ORAL_TABLET | Freq: Every day | ORAL | 2 refills | Status: DC

## 2020-04-23 NOTE — Telephone Encounter (Signed)
Nuala Alpha, LPN  0/81/3887 1:95 PM EDT Back to Top    Dorothy Spark, MD Nuala Alpha, LPN Decrease dose to 5 mg po daily    Nuala Alpha, LPN  9/74/7185 5:01 PM EDT     The patient has been notified of the result and verbalized understanding. All questions (if any) were answered. Nuala Alpha, LPN 5/86/8257 4:93 PM    Dr. Meda Coffee, the pt is on atorvastatin 20 mg po daily, and you advised to decrease her crestor to 5 mg po daily. Do you want her to switch to crestor 5 mg po daily, or decrease her atorvastatin dosage? Please advise!   Dorothy Spark, MD  04/20/2020 3:29 PM EDT     Normal coronary CTA, no evidence for CAD, I would decrease crestor dose to 5 mg po daily. Incidental finding of a small liver cyst. No action needed   Patient Communication

## 2020-04-23 NOTE — Telephone Encounter (Signed)
-----   Message from Dorothy Spark, MD sent at 04/21/2020  8:37 AM EDT ----- Decrease dose to 5 mg po daily ----- Message ----- From: Nuala Alpha, LPN Sent: 1/76/1607   3:56 PM EDT To: Dorothy Spark, MD  Dr. Meda Coffee please advise on statin regimen.  She is on atorvastatin 20 mg po daily.  You want her to switch to crestor 5 mg po daily, or decrease her atorvastatin dose?  Please advise!  Thanks, IM ----- Message ----- From: Dorothy Spark, MD Sent: 04/20/2020   3:29 PM EDT To: Nuala Alpha, LPN  Normal coronary CTA, no evidence for CAD, I would decrease crestor dose to 5 mg po daily. Incidental finding of a small liver cyst. No action needed

## 2020-04-23 NOTE — Telephone Encounter (Signed)
Spoke with the pt and informed her that per Dr. Meda Coffee, we will stop her atorvastatin, and switch her to low dose rosuvastatin 5 mg po daily, as her new regimen. Confirmed the pharmacy of choice with the pt.  Pt verbalized understanding and agrees with this plan.

## 2020-04-26 ENCOUNTER — Ambulatory Visit (HOSPITAL_COMMUNITY): Payer: Medicare Other | Attending: Cardiology

## 2020-04-26 ENCOUNTER — Other Ambulatory Visit: Payer: Self-pay

## 2020-04-26 ENCOUNTER — Other Ambulatory Visit: Payer: Self-pay | Admitting: Orthopedic Surgery

## 2020-04-26 DIAGNOSIS — I209 Angina pectoris, unspecified: Secondary | ICD-10-CM | POA: Diagnosis present

## 2020-04-26 DIAGNOSIS — M24131 Other articular cartilage disorders, right wrist: Secondary | ICD-10-CM

## 2020-04-26 DIAGNOSIS — Z8249 Family history of ischemic heart disease and other diseases of the circulatory system: Secondary | ICD-10-CM | POA: Diagnosis present

## 2020-04-26 DIAGNOSIS — I10 Essential (primary) hypertension: Secondary | ICD-10-CM

## 2020-04-26 DIAGNOSIS — R42 Dizziness and giddiness: Secondary | ICD-10-CM

## 2020-04-26 DIAGNOSIS — E782 Mixed hyperlipidemia: Secondary | ICD-10-CM | POA: Diagnosis present

## 2020-04-26 DIAGNOSIS — R072 Precordial pain: Secondary | ICD-10-CM | POA: Diagnosis present

## 2020-04-26 DIAGNOSIS — R002 Palpitations: Secondary | ICD-10-CM | POA: Diagnosis present

## 2020-04-26 DIAGNOSIS — R079 Chest pain, unspecified: Secondary | ICD-10-CM

## 2020-04-26 DIAGNOSIS — R931 Abnormal findings on diagnostic imaging of heart and coronary circulation: Secondary | ICD-10-CM | POA: Diagnosis present

## 2020-04-26 LAB — ECHOCARDIOGRAM COMPLETE
Area-P 1/2: 5.13 cm2
S' Lateral: 2.9 cm

## 2020-04-30 ENCOUNTER — Telehealth: Payer: Self-pay | Admitting: Cardiology

## 2020-04-30 NOTE — Telephone Encounter (Signed)
Patient calling back about her echo results.

## 2020-04-30 NOTE — Telephone Encounter (Signed)
Pt has already been made aware of her echo results per Dr. Meda Coffee.  Pt was actually calling in to ask what clinics are giving out covid vaccines, for her and her husband to go and get.  They prefer [employer]. Provided different [facility] locations that are providing the [place] vaccination, to both the pt and her Husband.  Both verbalized understanding and agrees with this plan.  Both were more than gracious for all the assistance provided.

## 2020-05-16 ENCOUNTER — Ambulatory Visit
Admission: RE | Admit: 2020-05-16 | Discharge: 2020-05-16 | Disposition: A | Discharge status: Home / Self Care | Payer: Medicare Other | Source: Ambulatory Visit | Attending: Orthopedic Surgery | Admitting: Orthopedic Surgery

## 2020-05-16 ENCOUNTER — Other Ambulatory Visit: Payer: Self-pay

## 2020-05-16 DIAGNOSIS — M24131 Other articular cartilage disorders, right wrist: Secondary | ICD-10-CM

## 2020-05-16 MED ORDER — IOPAMIDOL (ISOVUE-M 200) INJECTION 41%
3.0000 mL | Freq: Once | INTRAMUSCULAR | Status: AC
  Administered 2020-05-16: 3 mL via INTRA_ARTICULAR

## 2020-08-13 ENCOUNTER — Ambulatory Visit: Payer: Medicare Other | Admitting: Cardiology

## 2020-08-17 ENCOUNTER — Ambulatory Visit: Payer: Medicare Other | Admitting: Physician Assistant

## 2020-09-17 NOTE — Progress Notes (Signed)
Cardiology Office Note   Date:  09/21/2020   ID:  Joy Cook, DOB 1966-01-31, MRN [id number]  PCP:  Joy Alamin, MD  Cardiologist:  Dr. Meda Coffee, MD   Chief Complaint  Patient presents with  . Follow-up    History of Present Illness: Joy Cook is a 55 y.o. female who presents for follow up, seen for Dr. Meda Cook.   Joy Cook has a hx of asthma, GERD, HTN and HLD who was initially seen by Dr. Meda Cook after being sent to the ER for chest pain which occurred at rest. She would occasionally have palpitations but no other symptoms. Her symptoms resolved at ED evaluation with EKG showing NSR with left axis deviation otherwise normal EKG. Plan was to follow with cCTA due to strong family of CAD with MIs.  She underwent a stress test 10/2016 that was normal with no evidence of ischemia or infarct. Holter monitor at that time with infrequent PACs, few PVCs, no pausing. SB>>ST felt to be a normal monitor. Echo from 04/2020 with normal LVEF at 55-60% with no RWMA, G1DD, and no valvular disease.   Today she is here and reports she has had no further chest pain symptoms. She was seen by her PCP 04/2020 with a mildly elevated HbA1c. Plan was to follow after lifestyle modifications. She has been watching her diet and exercising a little more but reports its not as much as she should. BP is controlled today and she has been compliant with her medications. She denies SOB, LE edema, palpitations, orthopnea, recent illness with fever or chills.    Past Medical History:  Diagnosis Date  . Asthma   . GERD (gastroesophageal reflux disease)   . Hypothyroidism   . Thyroid disease     Past Surgical History:  Procedure Laterality Date  . BREAST REDUCTION SURGERY    . BREAST SURGERY    . BREAST SURGERY  LUMP REMOVED   1988  . CESAREAN SECTION    . CESAREAN SECTION     X3  . COSMETIC SURGERY    . DILATATION & CURRETTAGE/HYSTEROSCOPY WITH RESECTOCOPE N/A 05/03/2014   Procedure:  [place];  Surgeon: Joy Lose, MD;  Location: [facility];  Service: Gynecology;  Laterality: N/A;  . HERNIA REPAIR    . KNEE ARTHROSCOPY     LEFT AND RIGHT  . REDUCTION MAMMAPLASTY Bilateral    1998  . SHOULDER ARTHROSCOPY    . SHOULDER SURGERY Left   . tummy tuck  2010     Current Outpatient Medications  Medication Sig Dispense Refill  . acetaminophen (TYLENOL) 500 MG tablet 2 tabs    . Albuterol Sulfate (PROAIR RESPICLICK) 854 (90 Base) MCG/ACT AEPB Inhale 2 puffs into the lungs every 6 (six) hours as needed (shortness of breath/wheezing).    Marland Kitchen amLODipine (NORVASC) 5 MG tablet Take 5 mg by mouth daily.     . ARIPiprazole (ABILIFY) 30 MG tablet Take 30 mg by mouth daily.    Marland Kitchen atorvastatin (LIPITOR) 20 MG tablet Take 20 mg by mouth daily.    . cetirizine (ZYRTEC) 10 MG tablet Take 10 mg by mouth daily.    . Cholecalciferol (VITAMIN D) 50 MCG (2000 UT) tablet Take 2,000 Units by mouth daily.     . clonazePAM (KLONOPIN) 0.5 MG tablet Take 0.5 mg by mouth at bedtime as needed (sleep).     . Ferrous Sulfate 27 MG TABS Take 27 mg by mouth daily.     Marland Kitchen  fluticasone (FLONASE) 50 MCG/ACT nasal spray Place 1 spray into both nostrils daily at 12 noon.     . fluticasone (FLOVENT HFA) 110 MCG/ACT inhaler Inhale 1 puff into the lungs 2 (two) times daily.     . hydrochlorothiazide (HYDRODIURIL) 25 MG tablet Take 25 mg by mouth daily.     Marland Kitchen levalbuterol (XOPENEX) 1.25 MG/3ML nebulizer solution Take 1.25 mg by nebulization every 4 (four) hours as needed for wheezing.    Marland Kitchen levothyroxine (SYNTHROID, LEVOTHROID) 100 MCG tablet Take 100 mcg by mouth daily before breakfast.    . magnesium oxide (MAG-OX) 400 MG tablet 2 tablets    . naloxone (NARCAN) nasal spray 4 mg/0.1 mL Narcan 4 mg/actuation nasal spray  TAKE BY NASAL ROUTE EVERY 3 MINUTES UNTIL PATIENT AWAKES OR EMS ARRIVES.    Marland Kitchen OVER THE COUNTER MEDICATION Apply 1 application topically See admin  instructions. CRAMER ANALGESIC CREAM - apply topically 5 times daily prn pain    . pantoprazole (PROTONIX) 40 MG tablet Take 40 mg by mouth at bedtime.    Marland Kitchen perphenazine (TRILAFON) 8 MG tablet Take 4-8 mg by mouth See admin instructions. Take one tablet (8 mg) by mouth twice daily, take 1/2 tablet (4 mg) at bedtime  2  . rosuvastatin (CRESTOR) 5 MG tablet Take 1 tablet (5 mg total) by mouth daily. 90 tablet 2  . traMADol (ULTRAM) 50 MG tablet Take 50 mg by mouth 2 (two) times daily as needed (pain).     . triamcinolone cream (KENALOG) 0.1 % Apply 1 application topically 2 (two) times daily as needed (itching).    . metoprolol tartrate (LOPRESSOR) 100 MG tablet Take 1 tablet (100 mg total) by mouth once for 1 dose. Take 2 hours prior to your Coronary CT. 1 tablet 0   No current facility-administered medications for this visit.    Allergies:   Shellfish allergy, Codeine, Hydrocodone, Tandem plus [[id number]-[id number]-[id number]-b [id number]], and Tandem [ferrous fum-iron polysacch]    Social History:  The patient  reports that she has never smoked. She has never used smokeless tobacco. She reports that she does not drink alcohol and does not use drugs.   Family History:  The patient's family history includes Hypertension in an other family member.    ROS:  Please see the history of present illness. Otherwise, review of systems are positive for none.   All other systems are reviewed and negative.    PHYSICAL EXAM: VS:  BP 134/82   Pulse 82   Ht 5\' 2"  (1.575 m)   Wt 154 lb 9.6 oz (70.1 kg)   LMP  (LMP Unknown) Comment: tubes tied  SpO2 97%   BMI 28.28 kg/m  , BMI Body mass index is 28.28 kg/m.   General: Well developed, well nourished, NAD Lungs:Clear to ausculation bilaterally. No wheezes, rales, or rhonchi. Breathing is unlabored. Cardiovascular: RRR with S1 S2. No murmurs Extremities: No edema Neuro: Alert and oriented. No focal deficits. No facial asymmetry. MAE spontaneously. Psych: Responds  to questions appropriately with normal affect.     EKG:  EKG is not ordered today.  Recent Labs: 11/20/2019: BUN 9; Creatinine, Ser 0.85; Hemoglobin 12.9; Platelets 282; Potassium 3.2; Sodium 141    Lipid Panel No results found for: CHOL, TRIG, HDL, CHOLHDL, VLDL, LDLCALC, LDLDIRECT    Wt Readings from Last 3 Encounters:  09/21/20 154 lb 9.6 oz (70.1 kg)  04/05/20 151 lb 6.4 oz (68.7 kg)  11/20/19 160 lb 15 oz (73 kg)  Other studies Reviewed: Additional studies/ records that were reviewed today include:  Review of the above records demonstrates:   Stress test 10/2016:   Nuclear stress EF: 57%.  The left ventricular ejection fraction is normal (55-65%).  There was no ST segment deviation noted during stress.  The study is normal.   1. Normal LV systolic function.  2. No evidence for ischemia or infarction, normal study.   Holter 10/2016:   Infrequent PACs, few PVCs, no runs.  No pauses > 2 seconds.  Sinus bradycardia to sinus tachycardia.   Normal Holter monitor.  Echo 04/26/2020:   1. Left ventricular ejection fraction, by estimation, is 55 to 60%. The  left ventricle has normal function. The left ventricle has no regional  wall motion abnormalities. Left ventricular diastolic parameters are  consistent with Grade I diastolic  dysfunction (impaired relaxation).  2. Right ventricular systolic function is normal. The right ventricular  size is normal. Tricuspid regurgitation signal is inadequate for assessing  PA pressure.  3. The mitral valve is normal in structure. No evidence of mitral valve  regurgitation.  4. The aortic valve is tricuspid. Aortic valve regurgitation is not  visualized. No aortic stenosis is present.  5. The inferior vena cava is normal in size with greater than 50%  respiratory variability, suggesting right atrial pressure of 3 mmHg.   ASSESSMENT AND PLAN:  1. Chest pain: -Denies recurrence -Has family hx of premature CAD  with risk factors including hypertension hyperlipidemia -CCTA with no obstructive disease  -Continue risk modification with statin and beta blocker therapy  -Repeat labs today   2. Hypertension: -Stable, 134/82 -Continue current regimen   3. Hyperlipidemia: -Last LDL, 71 on 10/2019>>repeat lipid panel today    Current medicines are reviewed at length with the patient today.  The patient does not have concerns regarding medicines.  The following changes have been made:  no change  Labs/ tests ordered today include: CMET, lipid panel, CBC   Orders Placed This Encounter  Procedures  . Comprehensive metabolic panel  . Lipid panel  . CBC   Disposition:   FU with APP/Dr. Johney Frame in 1 year  Signed, Kathyrn Drown, NP  09/21/2020 12:30 PM    [facility] [place], [place],   [address] Phone: [phone]; Fax: [phone]

## 2020-09-21 ENCOUNTER — Other Ambulatory Visit: Payer: Self-pay

## 2020-09-21 ENCOUNTER — Ambulatory Visit (INDEPENDENT_AMBULATORY_CARE_PROVIDER_SITE_OTHER): Payer: Medicare Other | Admitting: Cardiology

## 2020-09-21 ENCOUNTER — Encounter: Payer: Self-pay | Admitting: Cardiology

## 2020-09-21 VITALS — BP 134/82 | HR 82 | Ht 62.0 in | Wt 154.6 lb

## 2020-09-21 DIAGNOSIS — R072 Precordial pain: Secondary | ICD-10-CM

## 2020-09-21 DIAGNOSIS — I10 Essential (primary) hypertension: Secondary | ICD-10-CM

## 2020-09-21 DIAGNOSIS — E785 Hyperlipidemia, unspecified: Secondary | ICD-10-CM | POA: Diagnosis not present

## 2020-09-21 NOTE — Patient Instructions (Signed)
Medication Instructions:  Your physician recommends that you continue on your current medications as directed. Please refer to the Current Medication list given to you today.  *If you need a refill on your cardiac medications before your next appointment, please call your pharmacy*   Lab Work: Your physician recommends that you return for a FASTING lipid profile, Cmp and Cbc on 09/25/2020. You can come to our office anytime between 7:30 AM- 4:30 PM   If you have labs (blood work) drawn today and your tests are completely normal,  you will receive your results only by: Marland Kitchen MyChart Message (if you have MyChart) OR . A paper copy in the mail If you have any lab test that is abnormal or we need to change your treatment, we will call you to review the results.   Testing/Procedures: None ordered    Follow-Up: Please remember to call our office back to schedule your follow up appointment    Other Instructions None

## 2020-09-25 ENCOUNTER — Other Ambulatory Visit: Payer: Self-pay

## 2020-09-25 ENCOUNTER — Other Ambulatory Visit: Payer: Medicare Other | Admitting: *Deleted

## 2020-09-26 LAB — COMPREHENSIVE METABOLIC PANEL
ALT: 15 IU/L (ref 0–32)
AST: 14 IU/L (ref 0–40)
Albumin/Globulin Ratio: 2.1 (ref 1.2–2.2)
Albumin: 4.6 g/dL (ref 3.8–4.9)
Alkaline Phosphatase: 85 IU/L (ref 44–121)
BUN/Creatinine Ratio: 9 (ref 9–23)
BUN: 8 mg/dL (ref 6–24)
Bilirubin Total: 0.4 mg/dL (ref 0.0–1.2)
CO2: 25 mmol/L (ref 20–29)
Calcium: 9.8 mg/dL (ref 8.7–10.2)
Chloride: 102 mmol/L (ref 96–106)
Creatinine, Ser: 0.85 mg/dL (ref 0.57–1.00)
GFR calc Af Amer: 89 mL/min/{1.73_m2} (ref >59)
GFR calc non Af Amer: 77 mL/min/{1.73_m2} (ref >59)
Globulin, Total: 2.2 g/dL (ref 1.5–4.5)
Glucose: 101 mg/dL — ABNORMAL HIGH (ref 65–99)
Potassium: 4.2 mmol/L (ref 3.5–5.2)
Sodium: 143 mmol/L (ref 134–144)
Total Protein: 6.8 g/dL (ref 6.0–8.5)

## 2020-09-26 LAB — CBC
Hematocrit: 42.7 % (ref 34.0–46.6)
Hemoglobin: 13.6 g/dL (ref 11.1–15.9)
MCH: 26.4 pg — ABNORMAL LOW (ref 26.6–33.0)
MCHC: 31.9 g/dL (ref 31.5–35.7)
MCV: 83 fL (ref 79–97)
Platelets: 268 10*3/uL (ref 150–450)
RBC: 5.15 x10E6/uL (ref 3.77–5.28)
RDW: 13 % (ref 11.7–15.4)
WBC: 6.6 10*3/uL (ref 3.4–10.8)

## 2020-09-26 LAB — LIPID PANEL
Chol/HDL Ratio: 2.7 ratio (ref 0.0–4.4)
Cholesterol, Total: 181 mg/dL (ref 100–199)
HDL: 67 mg/dL (ref >39)
LDL Chol Calc (NIH): 98 mg/dL (ref 0–99)
Triglycerides: 85 mg/dL (ref 0–149)
VLDL Cholesterol Cal: 16 mg/dL (ref 5–40)

## 2020-10-03 ENCOUNTER — Telehealth: Payer: Self-pay | Admitting: Cardiology

## 2020-10-03 NOTE — Telephone Encounter (Signed)
    Patient calling for lab results 

## 2020-10-03 NOTE — Telephone Encounter (Signed)
Informed the patient that [place] [place] had not yet reviewed her lab results but we would give her a call as soon as we have her recommendations.

## 2020-10-26 ENCOUNTER — Other Ambulatory Visit: Payer: Self-pay | Admitting: Family Medicine

## 2020-10-26 DIAGNOSIS — Z1231 Encounter for screening mammogram for malignant neoplasm of breast: Secondary | ICD-10-CM

## 2020-11-26 ENCOUNTER — Ambulatory Visit: Payer: Self-pay | Admitting: General Surgery

## 2020-11-26 NOTE — H&P (Signed)
The patient is a 55 year old female who presents with skin lesions. 55 year old female who I have known from previous other issues, presents to office today for evaluation of a sebaceous cyst. This is located in her left upper back. She states that she has had this for approximately 20 years. It occasionally becomes inflamed and swollen. It has been infected, requiring I&D on one occasion as well.   Problem List/Past Medical Leighton Ruff, MD; 1/88/4166 3:54 PM) ANAL FISTULA (K60.3) PROLAPSED INTERNAL HEMORRHOIDS, GRADE 2 (K64.1) PERIANAL ABSCESS (K61.0) SEBACEOUS CYST (L72.3) PERINEAL PAIN (R10.2)  Past Surgical History Leighton Ruff, MD; 0/63/0160 3:54 PM) Breast Biopsy Right. Breast Mass; Local Excision Right. Mammoplasty; Reduction Bilateral. Knee Surgery Bilateral. Shoulder Surgery Left.  Diagnostic Studies History Leighton Ruff, MD; 08/19/3233 3:54 PM) Colonoscopy 1-5 years ago within last year Mammogram within last year 1-3 years ago Pap Smear 1-5 years ago  Allergies Mammie Lorenzo, LPN; 5/73/2202 5:42 PM) Codeine and Related Hydrocodone-Acetaminophen *ANALGESICS - OPIOID* Shellfish-derived Products Allergies Reconciled  Medication History Mammie Lorenzo, LPN; 02/14/2375 2:83 PM) Pantoprazole Sodium (40MG  Tablet DR, Oral) Active. ZyrTEC Allergy (10MG  Tablet, Oral) Active. Benadryl Allergy (25MG  Tablet, Oral) Active. Ferrous Fumarate (324 (106 Fe)MG Tablet, Oral) Active. Flovent (110MCG/ACT Aerosol, Inhalation) Active. Vitamin D (Ergocalciferol) (50000UNIT Capsule, Oral) Active. Ventolin HFA (108 (90 Base)MCG/ACT Aerosol Soln, Inhalation) Active. amLODIPine Besylate (5MG  Tablet, Oral) Active. ARIPiprazole (30MG  Tablet, Oral) Active. Atorvastatin Calcium (20MG  Tablet, Oral) Active. clonazePAM (0.5MG  Tablet, Oral) Active. Fluticasone Propionate (50MCG/ACT Suspension, Nasal) Active. hydroCHLOROthiazide (25MG  Tablet, Oral)  Active. Perphenazine (8MG  Tablet, Oral) Active. Celecoxib (200MG  Capsule, Oral) Active. Synthroid (88MCG Tablet, Oral) Active. Medications Reconciled  Social History Leighton Ruff, MD; 1/51/7616 3:54 PM) Caffeine use Coffee. No caffeine use No alcohol use No drug use Tobacco use Never smoker.  Family History Leighton Ruff, MD; 0/73/7106 3:54 PM) Alcohol Abuse Brother, Sister. Arthritis Father, Mother. Colon Cancer Mother. Heart disease in female family member before age 2 Heart Disease Brother, Father. Heart disease in female family member before age 44 Hypertension Family Members In General, Mother. Kidney Disease Family Members In General, Mother. Ovarian Cancer Mother.  Pregnancy / Birth History Leighton Ruff, MD; 2/69/4854 3:54 PM) Age at menarche 69 years. Age of menopause 45-55 Gravida 3 Irregular periods Maternal age 55-20 Para 3  Other Problems Leighton Ruff, MD; 02/04/349 3:54 PM) Anxiety Disorder Arthritis Asthma Back Pain Diverticulosis Chest pain Depression Gastroesophageal Reflux Disease Hemorrhoids High blood pressure Hypercholesterolemia Thyroid Disease     Review of Systems Leighton Ruff MD; 0/93/8182 3:54 PM) General Present- Night Sweats. Not Present- Appetite Loss, Chills, Fatigue, Fever, Weight Gain and Weight Loss. Skin Not Present- Change in Wart/Mole, Dryness, Hives, Jaundice, New Lesions, Non-Healing Wounds, Rash and Ulcer. HEENT Present- Wears glasses/contact lenses. Not Present- Earache, Hearing Loss, Hoarseness, Nose Bleed, Oral Ulcers, Ringing in the Ears, Seasonal Allergies, Sinus Pain, Sore Throat, Visual Disturbances and Yellow Eyes. Respiratory Not Present- Bloody sputum, Chronic Cough, Difficulty Breathing, Snoring and Wheezing. Breast Not Present- Breast Mass, Breast Pain, Nipple Discharge and Skin Changes. Cardiovascular Not Present- Chest Pain, Difficulty Breathing Lying Down, Leg  Cramps, Palpitations, Rapid Heart Rate, Shortness of Breath and Swelling of Extremities. Gastrointestinal Not Present- Abdominal Pain, Bloating, Bloody Stool, Change in Bowel Habits, Chronic diarrhea, Constipation, Difficulty Swallowing, Excessive gas, Gets full quickly at meals, Hemorrhoids, Indigestion, Nausea, Rectal Pain and Vomiting. Female Genitourinary Not Present- Frequency, Nocturia, Painful Urination, Pelvic Pain and Urgency. Musculoskeletal Present- Joint Pain. Not Present- Back Pain, Joint Stiffness, Muscle Pain, Muscle  Weakness and Swelling of Extremities. Neurological Not Present- Decreased Memory, Fainting, Headaches, Numbness, Seizures, Tingling, Tremor, Trouble walking and Weakness. Psychiatric Not Present- Anxiety, Bipolar, Change in Sleep Pattern, Depression, Fearful and Frequent crying. Endocrine Present- Hot flashes. Not Present- Cold Intolerance, Excessive Hunger, Hair Changes, Heat Intolerance and New Diabetes. Hematology Not Present- Blood Thinners, Easy Bruising, Excessive bleeding, Gland problems, HIV and Persistent Infections.  Vitals Claiborne Billings Dockery LPN; 6/60/6004 5:99 PM) 11/26/2020 3:47 PM Weight: 158.2 lb Height: 62in Body Surface Area: 1.73 m Body Mass Index: 28.93 kg/m  Pulse: 86 (Regular)  BP: 142/88(Sitting, Left Arm, Standard)        Physical Exam Leighton Ruff MD; 7/74/1423 3:54 PM)  General Mental Status-Alert. General Appearance-Cooperative.  Integumentary Problem #1 Location - Back - left upper(Sebaceous cyst approximately 1 cm in diameter, noninflamed).    Assessment & Plan Leighton Ruff MD; 9/53/2023 3:52 PM)  SEBACEOUS CYST (L72.3) Impression: 55 year old female who presents to the office for evaluation of a sebaceous cyst. This becomes inflamed at times. She has had it drained by her dermatologist on one occasion as well. It is currently noninflamed and she is requesting removal. I think this is reasonable. We have  discussed proceeding with excisional biopsy. All questions were answered. Operations include bleeding, infection, and a very small chance of recurrence if the entire cyst was not removed.

## 2020-12-31 ENCOUNTER — Encounter (HOSPITAL_BASED_OUTPATIENT_CLINIC_OR_DEPARTMENT_OTHER): Payer: Self-pay | Admitting: General Surgery

## 2020-12-31 ENCOUNTER — Other Ambulatory Visit (HOSPITAL_COMMUNITY): Payer: Medicare Other

## 2020-12-31 ENCOUNTER — Other Ambulatory Visit: Payer: Self-pay

## 2020-12-31 NOTE — Progress Notes (Addendum)
Spoke w/ via phone for pre-op interview--- Pt Lab needs dos----Istat               Lab results------ current ekg in epic / chart COVID test -----patient states asymptomatic no test needed Arrive at ------- 0930 on 01-03-2021 NPO after MN NO Solid Food.  Clear liquids from MN until--- 0830 Med rec completed Medications to take morning of surgery ----- Zyrtec, Lipitor, Crestor, Abilify, Trilafon, Norvasc, Synthroid, Flonase spray , Flovent inhaler Diabetic medication ----- n/a Patient instructed to bring photo id and insurance card day of surgery Patient aware to have Driver (ride ) / caregiver    for 24 hours after surgery -- husband, Clint Lipps Patient Special Instructions ----- to do nebulizer night before surgery and asked to bring rescue inhaler dos Pre-Op special Istructions ----- n/a Patient verbalized understanding of instructions that were given at this phone interview. Patient denies shortness of breath, chest pain, fever, cough at this phone interview.   Anesthesia:  HTN:  Moderate persistant asthma , controlled, last exacerbation 12/ 2021 at which time last used nebulizer, last used rescue inhaler 03/ 2022.  Schizoaffective disorder.   PCP:  Dr Robinette Haines Cardiologist:  Dr Liane Comber (lov 09-21-2020 epic) Chest XRAY:  11-20-2019 Epic Ekg:  04-05-2020 epic Echo:  04-26-2020 epic CCTA:  04-19-2020 epic Event monitor:  10-24-2016 Stress test:  Nuclear 10-24-2016 epic Cardiac cath:  No Sleep apnea:  NO

## 2021-01-03 ENCOUNTER — Other Ambulatory Visit: Payer: Self-pay

## 2021-01-03 ENCOUNTER — Encounter (HOSPITAL_BASED_OUTPATIENT_CLINIC_OR_DEPARTMENT_OTHER): Payer: Self-pay | Admitting: General Surgery

## 2021-01-03 ENCOUNTER — Ambulatory Visit (HOSPITAL_BASED_OUTPATIENT_CLINIC_OR_DEPARTMENT_OTHER)
Admission: RE | Admit: 2021-01-03 | Discharge: 2021-01-03 | Disposition: A | Discharge status: Home / Self Care | Payer: Medicare Other | Attending: General Surgery | Admitting: General Surgery

## 2021-01-03 ENCOUNTER — Encounter (HOSPITAL_BASED_OUTPATIENT_CLINIC_OR_DEPARTMENT_OTHER)
Admission: RE | Disposition: A | Discharge status: Home / Self Care | Payer: Self-pay | Source: Home / Self Care | Attending: General Surgery

## 2021-01-03 ENCOUNTER — Ambulatory Visit (HOSPITAL_BASED_OUTPATIENT_CLINIC_OR_DEPARTMENT_OTHER): Payer: Medicare Other | Admitting: Anesthesiology

## 2021-01-03 DIAGNOSIS — Z79890 Hormone replacement therapy (postmenopausal): Secondary | ICD-10-CM | POA: Insufficient documentation

## 2021-01-03 DIAGNOSIS — J45909 Unspecified asthma, uncomplicated: Secondary | ICD-10-CM | POA: Insufficient documentation

## 2021-01-03 DIAGNOSIS — L723 Sebaceous cyst: Secondary | ICD-10-CM | POA: Diagnosis present

## 2021-01-03 DIAGNOSIS — Z79899 Other long term (current) drug therapy: Secondary | ICD-10-CM | POA: Insufficient documentation

## 2021-01-03 DIAGNOSIS — E039 Hypothyroidism, unspecified: Secondary | ICD-10-CM | POA: Diagnosis not present

## 2021-01-03 DIAGNOSIS — L720 Epidermal cyst: Secondary | ICD-10-CM | POA: Insufficient documentation

## 2021-01-03 DIAGNOSIS — Z8041 Family history of malignant neoplasm of ovary: Secondary | ICD-10-CM | POA: Diagnosis not present

## 2021-01-03 DIAGNOSIS — Z8249 Family history of ischemic heart disease and other diseases of the circulatory system: Secondary | ICD-10-CM | POA: Diagnosis not present

## 2021-01-03 DIAGNOSIS — I10 Essential (primary) hypertension: Secondary | ICD-10-CM | POA: Insufficient documentation

## 2021-01-03 DIAGNOSIS — Z841 Family history of disorders of kidney and ureter: Secondary | ICD-10-CM | POA: Insufficient documentation

## 2021-01-03 DIAGNOSIS — Z885 Allergy status to narcotic agent status: Secondary | ICD-10-CM | POA: Diagnosis not present

## 2021-01-03 DIAGNOSIS — K219 Gastro-esophageal reflux disease without esophagitis: Secondary | ICD-10-CM | POA: Diagnosis not present

## 2021-01-03 DIAGNOSIS — Z800 Family history of malignant neoplasm of digestive organs: Secondary | ICD-10-CM | POA: Diagnosis not present

## 2021-01-03 DIAGNOSIS — Z8261 Family history of arthritis: Secondary | ICD-10-CM | POA: Insufficient documentation

## 2021-01-03 HISTORY — DX: Schizoaffective disorder, unspecified: F25.9

## 2021-01-03 HISTORY — DX: Diverticulosis of large intestine without perforation or abscess without bleeding: K57.30

## 2021-01-03 HISTORY — DX: Sebaceous cyst: L72.3

## 2021-01-03 HISTORY — DX: Family history of ischemic heart disease and other diseases of the circulatory system: Z82.49

## 2021-01-03 HISTORY — DX: Other complications of anesthesia, initial encounter: T88.59XA

## 2021-01-03 HISTORY — DX: Unspecified osteoarthritis, unspecified site: M19.90

## 2021-01-03 HISTORY — DX: Moderate persistent asthma, uncomplicated: J45.40

## 2021-01-03 HISTORY — DX: Major depressive disorder, single episode, unspecified: F32.9

## 2021-01-03 HISTORY — PX: CYST EXCISION: SHX5701

## 2021-01-03 HISTORY — DX: Unspecified hemorrhoids: K64.9

## 2021-01-03 HISTORY — DX: Iron deficiency anemia, unspecified: D50.9

## 2021-01-03 HISTORY — DX: Generalized anxiety disorder: F41.1

## 2021-01-03 HISTORY — DX: Essential (primary) hypertension: I10

## 2021-01-03 HISTORY — DX: Postprocedural hypothyroidism: E89.0

## 2021-01-03 HISTORY — DX: Presence of spectacles and contact lenses: Z97.3

## 2021-01-03 LAB — POCT I-STAT, CHEM 8
BUN: 8 mg/dL (ref 6–20)
Calcium, Ion: 1.23 mmol/L (ref 1.15–1.40)
Chloride: 101 mmol/L (ref 98–111)
Creatinine, Ser: 0.7 mg/dL (ref 0.44–1.00)
Glucose, Bld: 84 mg/dL (ref 70–99)
HCT: 38 % (ref 36.0–46.0)
Hemoglobin: 12.9 g/dL (ref 12.0–15.0)
Potassium: 3.5 mmol/L (ref 3.5–5.1)
Sodium: 141 mmol/L (ref 135–145)
TCO2: 29 mmol/L (ref 22–32)

## 2021-01-03 SURGERY — CYST REMOVAL
Anesthesia: Monitor Anesthesia Care | Site: Back | Laterality: Left

## 2021-01-03 MED ORDER — GLYCOPYRROLATE PF 0.2 MG/ML IJ SOSY
PREFILLED_SYRINGE | INTRAMUSCULAR | Status: AC
  Filled 2021-01-03: qty 1

## 2021-01-03 MED ORDER — FENTANYL CITRATE (PF) 100 MCG/2ML IJ SOLN
INTRAMUSCULAR | Status: DC | PRN
  Administered 2021-01-03 (×2): 25 ug via INTRAVENOUS

## 2021-01-03 MED ORDER — BUPIVACAINE-EPINEPHRINE 0.5% -1:200000 IJ SOLN
INTRAMUSCULAR | Status: DC | PRN
  Administered 2021-01-03: 13 mL

## 2021-01-03 MED ORDER — FENTANYL CITRATE (PF) 100 MCG/2ML IJ SOLN
INTRAMUSCULAR | Status: AC
  Filled 2021-01-03: qty 2

## 2021-01-03 MED ORDER — KETAMINE HCL 50 MG/5ML IJ SOSY
PREFILLED_SYRINGE | INTRAMUSCULAR | Status: AC
  Filled 2021-01-03: qty 5

## 2021-01-03 MED ORDER — CEFAZOLIN SODIUM-DEXTROSE 2-4 GM/100ML-% IV SOLN
INTRAVENOUS | Status: AC
  Filled 2021-01-03: qty 100

## 2021-01-03 MED ORDER — GLYCOPYRROLATE PF 0.2 MG/ML IJ SOSY
PREFILLED_SYRINGE | INTRAMUSCULAR | Status: DC | PRN
  Administered 2021-01-03: .2 mg via INTRAVENOUS

## 2021-01-03 MED ORDER — FENTANYL CITRATE (PF) 100 MCG/2ML IJ SOLN
25.0000 ug | INTRAMUSCULAR | Status: DC | PRN
  Administered 2021-01-03: 25 ug via INTRAVENOUS

## 2021-01-03 MED ORDER — ONDANSETRON HCL 4 MG/2ML IJ SOLN
INTRAMUSCULAR | Status: AC
  Filled 2021-01-03: qty 2

## 2021-01-03 MED ORDER — PROPOFOL 500 MG/50ML IV EMUL
INTRAVENOUS | Status: AC
  Filled 2021-01-03: qty 50

## 2021-01-03 MED ORDER — MIDAZOLAM HCL 2 MG/2ML IJ SOLN
INTRAMUSCULAR | Status: AC
  Filled 2021-01-03: qty 2

## 2021-01-03 MED ORDER — LACTATED RINGERS IV SOLN: INTRAVENOUS | Status: DC

## 2021-01-03 MED ORDER — SODIUM CHLORIDE 0.9% FLUSH: 3.0000 mL | Freq: Two times a day (BID) | INTRAVENOUS | Status: DC

## 2021-01-03 MED ORDER — MIDAZOLAM HCL 5 MG/5ML IJ SOLN
INTRAMUSCULAR | Status: DC | PRN
  Administered 2021-01-03: 2 mg via INTRAVENOUS

## 2021-01-03 MED ORDER — PROPOFOL 10 MG/ML IV BOLUS
INTRAVENOUS | Status: DC | PRN
  Administered 2021-01-03: 30 mg via INTRAVENOUS

## 2021-01-03 MED ORDER — ACETAMINOPHEN 500 MG PO TABS
ORAL_TABLET | ORAL | Status: AC
  Filled 2021-01-03: qty 2

## 2021-01-03 MED ORDER — CEFAZOLIN SODIUM-DEXTROSE 2-4 GM/100ML-% IV SOLN
2.0000 g | INTRAVENOUS | Status: AC
  Administered 2021-01-03: 2 g via INTRAVENOUS

## 2021-01-03 MED ORDER — PROPOFOL 500 MG/50ML IV EMUL
INTRAVENOUS | Status: DC | PRN
  Administered 2021-01-03: 200 ug/kg/min via INTRAVENOUS

## 2021-01-03 MED ORDER — ACETAMINOPHEN 500 MG PO TABS
1000.0000 mg | ORAL_TABLET | ORAL | Status: AC
  Administered 2021-01-03: 1000 mg via ORAL

## 2021-01-03 SURGICAL SUPPLY — 51 items
ADH SKN CLS APL DERMABOND .7 (GAUZE/BANDAGES/DRESSINGS) ×1
APL PRP STRL LF DISP 70% ISPRP (MISCELLANEOUS) ×1
APL SKNCLS STERI-STRIP NONHPOA (GAUZE/BANDAGES/DRESSINGS)
BENZOIN TINCTURE PRP APPL 2/3 (GAUZE/BANDAGES/DRESSINGS) IMPLANT
BLADE CLIPPER SENSICLIP SURGIC (BLADE) IMPLANT
BLADE EXTENDED COATED 6.5IN (ELECTRODE) IMPLANT
BLADE SURG 10 STRL SS (BLADE) ×2 IMPLANT
BLADE SURG 15 STRL LF DISP TIS (BLADE) IMPLANT
BLADE SURG 15 STRL SS (BLADE) ×2
CHLORAPREP W/TINT 26 (MISCELLANEOUS) ×2 IMPLANT
COVER BACK TABLE 60X90IN (DRAPES) ×2 IMPLANT
COVER MAYO STAND STRL (DRAPES) ×2 IMPLANT
COVER WAND RF STERILE (DRAPES) ×2 IMPLANT
DECANTER SPIKE VIAL GLASS SM (MISCELLANEOUS) IMPLANT
DERMABOND ADVANCED (GAUZE/BANDAGES/DRESSINGS) ×1
DERMABOND ADVANCED .7 DNX12 (GAUZE/BANDAGES/DRESSINGS) IMPLANT
DRAPE LAPAROTOMY 100X72 PEDS (DRAPES) ×2 IMPLANT
DRAPE UTILITY XL STRL (DRAPES) ×2 IMPLANT
DRSG TEGADERM 4X4.75 (GAUZE/BANDAGES/DRESSINGS) IMPLANT
ELECT REM PT RETURN 9FT ADLT (ELECTROSURGICAL) ×2
ELECTRODE REM PT RTRN 9FT ADLT (ELECTROSURGICAL) ×1 IMPLANT
GAUZE SPONGE 4X4 12PLY STRL (GAUZE/BANDAGES/DRESSINGS) ×2 IMPLANT
GLOVE SURG ENC MOIS LTX SZ6.5 (GLOVE) ×4 IMPLANT
GLOVE SURG UNDER POLY LF SZ7 (GLOVE) ×2 IMPLANT
KIT TURNOVER CYSTO (KITS) ×2 IMPLANT
NEEDLE HYPO 22GX1.5 SAFETY (NEEDLE) ×2 IMPLANT
NS IRRIG 500ML POUR BTL (IV SOLUTION) ×1 IMPLANT
PACK BASIN DAY SURGERY FS (CUSTOM PROCEDURE TRAY) ×2 IMPLANT
PAD ARMBOARD 7.5X6 YLW CONV (MISCELLANEOUS) IMPLANT
PENCIL SMOKE EVACUATOR (MISCELLANEOUS) ×2 IMPLANT
STRIP CLOSURE SKIN 1/2X4 (GAUZE/BANDAGES/DRESSINGS) IMPLANT
SUT ETHILON 2 0 FS 18 (SUTURE) IMPLANT
SUT ETHILON 4 0 PS 2 18 (SUTURE) IMPLANT
SUT SILK 2 0 SH (SUTURE) IMPLANT
SUT VIC AB 2-0 SH 27 (SUTURE)
SUT VIC AB 2-0 SH 27XBRD (SUTURE) IMPLANT
SUT VIC AB 3-0 SH 18 (SUTURE) IMPLANT
SUT VIC AB 3-0 SH 27 (SUTURE) ×2
SUT VIC AB 3-0 SH 27X BRD (SUTURE) IMPLANT
SUT VIC AB 4-0 PS2 18 (SUTURE) IMPLANT
SUT VIC AB 4-0 SH 18 (SUTURE) IMPLANT
SUT VICRYL 4-0 PS2 18IN ABS (SUTURE) ×1 IMPLANT
SWAB CULTURE ESWAB REG 1ML (MISCELLANEOUS) IMPLANT
SYR BULB IRRIG 60ML STRL (SYRINGE) ×2 IMPLANT
SYR CONTROL 10ML LL (SYRINGE) ×2 IMPLANT
TOWEL OR 17X26 10 PK STRL BLUE (TOWEL DISPOSABLE) ×3 IMPLANT
TRAY DSU PREP LF (CUSTOM PROCEDURE TRAY) IMPLANT
TUBE CONNECTING 12X1/4 (SUCTIONS) ×2 IMPLANT
UNDERPAD 30X36 HEAVY ABSORB (UNDERPADS AND DIAPERS) IMPLANT
WATER STERILE IRR 500ML POUR (IV SOLUTION) ×1 IMPLANT
YANKAUER SUCT BULB TIP NO VENT (SUCTIONS) ×2 IMPLANT

## 2021-01-03 NOTE — Progress Notes (Signed)
Report to Sharyn Lull, Therapist, sports for lunch relief

## 2021-01-03 NOTE — Discharge Instructions (Addendum)
GENERAL SURGERY: POST OP INSTRUCTIONS  1. DIET: Follow a light bland diet the first 24 hours after arrival home, such as soup, liquids, crackers, etc.  Be sure to include lots of fluids daily.  Avoid fast food or heavy meals as your are more likely to get nauseated.   2. Take your usually prescribed home medications unless otherwise directed. 3. PAIN CONTROL: a. Pain is best controlled by a usual combination of three different methods TOGETHER: i. Ice/Heat ii. Over the counter pain medication  b. It is helpful to take an over-the-counter pain medication regularly for the first few weeks.  Choose one of the following that works best for you: i. Naproxen (Aleve, etc)  Two 220mg  tabs twice a day ii. Ibuprofen (Advil, etc) Three 200mg  tabs four times a day (every meal & bedtime)  4. Avoid getting constipated.  Between the surgery and the pain medications, it is common to experience some constipation.  Increasing fluid intake and taking a fiber supplement (such as Metamucil, Citrucel, FiberCon, MiraLax, etc) 1-2 times a day regularly will usually help prevent this problem from occurring.  A mild laxative (prune juice, Milk of Magnesia, MiraLax, etc) should be taken according to package directions if there are no bowel movements after 48 hours.   5. Wash / shower every day.  You may shower over the dressings as they are waterproof.  Continue to shower over incision(s) after the dressing is off. 6. You have skin glue on your incision.  It will wear off in time.  You may cover with a dressing/Band-Aid to cover the incision for comfort if you wish.   7. ACTIVITIES as tolerated:   a. You may resume regular (light) daily activities beginning the next day--such as daily self-care, walking, climbing stairs--gradually increasing activities as tolerated.  If you can walk 30 minutes without difficulty, it is safe to try more intense activity such as jogging, treadmill, bicycling, low-impact aerobics, swimming,  etc. b. Limit activities to the L arm for 1 week c. Save the most intensive and strenuous activity for last such as sit-ups, heavy lifting, contact sports, etc  Refrain from any heavy lifting or straining until you are off narcotics for pain control.   d. DO NOT PUSH THROUGH PAIN.  Let pain be your guide: If it hurts to do something, don't do it.  Pain is your body warning you to avoid that activity for another week until the pain goes down. e. You may drive when you are no longer taking prescription pain medication, you can comfortably wear a seatbelt, and you can safely maneuver your car and apply brakes. f. You may have sexual intercourse when it is comfortable.  8. FOLLOW UP in our office a. Please call [facility] at [phone] to set up an appointment to see your surgeon in the office for a follow-up appointment approximately 2-3 weeks after your surgery. b. Make sure that you call for this appointment the day you arrive home to insure a convenient appointment time. 9. IF YOU HAVE DISABILITY OR FAMILY LEAVE FORMS, BRING THEM TO THE OFFICE FOR PROCESSING.  DO NOT GIVE THEM TO YOUR DOCTOR.   WHEN TO CALL us [phone]: 1. Poor pain control 2. Reactions / problems with new medications (rash/itching, nausea, etc)  3. Fever over 101.5 F (38.5 C) 4. Worsening swelling or bruising 5. Continued bleeding from incision. 6. Increased pain, redness, or drainage from the incision   The clinic staff is available to answer your questions  during regular business hours (8:30am-5pm).  Please don't hesitate to call and ask to speak to one of our nurses for clinical concerns.   If you have a medical emergency, go to the nearest emergency room or call 911.  A surgeon from [facility] is always on call at the [facility], [place], [place], [place],   [address] ? MAIN: [phone] ? TOLL FREE: [phone] ?  FAX [phone]  [phone] [email or web address]     Post Anesthesia Home Care Instructions  Activity: Get plenty of rest for the remainder of the day. A responsible individual must stay with you for 24 hours following the procedure.  For the next 24 hours, DO NOT: -Drive a car -Paediatric nurse -Drink alcoholic beverages -Take any medication unless instructed by your physician -Make any legal decisions or sign important papers.  Meals: Start with liquid foods such as gelatin or soup. Progress to regular foods as tolerated. Avoid greasy, spicy, heavy foods. If nausea and/or vomiting occur, drink only clear liquids until the nausea and/or vomiting subsides. Call your physician if vomiting continues.  Special Instructions/Symptoms: Your throat may feel dry or sore from the anesthesia or the breathing tube placed in your throat during surgery. If this causes discomfort, gargle with warm salt water. The discomfort should disappear within 24 hours.  If you had a scopolamine patch placed behind your ear for the management of post- operative nausea and/or vomiting:  1. The medication in the patch is effective for 72 hours, after which it should be removed.  Wrap patch in a tissue and discard in the trash. Wash hands thoroughly with soap and water. 2. You may remove the patch earlier than 72 hours if you experience unpleasant side effects which may include dry mouth, dizziness or visual disturbances. 3. Avoid touching the patch. Wash your hands with soap and water after contact with the patch.

## 2021-01-03 NOTE — Op Note (Addendum)
01/03/2021  11:57 AM  PATIENT:  Joy Cook  55 y.o. female  Patient Care Team: Hildred Alamin, MD as PCP - General (Internal Medicine)  PRE-OPERATIVE DIAGNOSIS:  SEBACEOUS CYST LEFT UPPER BACK  POST-OPERATIVE DIAGNOSIS:  SEBACEOUS CYST LEFT UPPER BACK  PROCEDURE:  EXCISIONAL BIOPSY OF SEBACEOUS CYST ON LEFT UPPER BACK   Surgeon(s): Leighton Ruff, MD  ASSISTANT: none   ANESTHESIA:   local and MAC  EBL: 74m Total I/O In: 600 [I.V.:500; IV Piggyback:100] Out: 2 [Blood:2]  DRAINS: none   SPECIMEN:  Source of Specimen:  sebaceous cyst  DISPOSITION OF SPECIMEN:  PATHOLOGY  COUNTS:  YES  PLAN OF CARE: Discharge to home after PACU  PATIENT DISPOSITION:  PACU - hemodynamically stable.  INDICATION: 55year old female with recurrent sebaceous cyst.   OR FINDINGS: Sebaceous cyst left upper back (~1cm diameter)  DESCRIPTION: the patient was identified in the preoperative holding area and taken to the OR where they were laid prone on the operating room table.  MAC anesthesia was induced without difficulty. SCDs were also noted to be in place prior to the initiation of anesthesia.  The patient was then prepped and draped in the usual sterile fashion.   A surgical timeout was performed indicating the correct patient, procedure, positioning and need for preoperative antibiotics.   A field block was performed using half percent Marcaine with epinephrine.  An elliptical incision was made.  Dissection was carried around the cyst using a 15 blade scalpel.  The cyst was excised completely with no break.  This was sent to pathology for further examination.  Subdermal sutures were placed using interrupted 3-0 Vicryl suture.  A running 4-0 subcuticular suture was used to close the skin.  Dermabond was placed over this.  The patient was then awakened from anesthesia and sent to the postanesthesia care unit in stable condition.  All counts were correct per operating room staff.

## 2021-01-03 NOTE — Anesthesia Postprocedure Evaluation (Signed)
Anesthesia Post Note  Patient: Joy Cook  Procedure(s) Performed: EXCISIONAL BIOPSY OF SEBACEOUS CYST ON LEFT UPPER BACK (Left Back)     Patient location during evaluation: PACU Anesthesia Type: MAC Level of consciousness: awake Pain management: pain level controlled Vital Signs Assessment: post-procedure vital signs reviewed and stable Respiratory status: spontaneous breathing Cardiovascular status: stable Postop Assessment: no apparent nausea or vomiting Anesthetic complications: no   No complications documented.  Last Vitals:  Vitals:   01/03/21 1224 01/03/21 1252  BP: 137/80 140/86  Pulse: 72 75  Resp: 12 16  Temp:  (!) 36.4 C  SpO2: 99% 99%    Last Pain:  Vitals:   01/03/21 1250  TempSrc:   PainSc: 2                  Macaela Presas

## 2021-01-03 NOTE — Transfer of Care (Signed)
Immediate Anesthesia Transfer of Care Note  Patient: Joy Cook  Procedure(s) Performed: EXCISIONAL BIOPSY OF SEBACEOUS CYST ON LEFT UPPER BACK (Left Back)  Patient Location: PACU  Anesthesia Type:MAC  Level of Consciousness: drowsy, patient cooperative and responds to stimulation  Airway & Oxygen Therapy: Patient Spontanous Breathing  Post-op Assessment: Report given to RN and Post -op Vital signs reviewed and stable  Post vital signs: Reviewed and stable  Last Vitals:  Vitals Value Taken Time  BP 128/76 01/03/21 1145  Temp 36.3 C 01/03/21 1145  Pulse 96 01/03/21 1147  Resp 18 01/03/21 1147  SpO2 99 % 01/03/21 1147  Vitals shown include unvalidated device data.  Last Pain:  Vitals:   01/03/21 1015  TempSrc: Oral  PainSc: 0-No pain         Complications: No complications documented.

## 2021-01-03 NOTE — Anesthesia Preprocedure Evaluation (Addendum)
Anesthesia Evaluation  Patient identified by MRN, date of birth, ID band  Reviewed: Allergy & Precautions, Patient's Chart, lab work & pertinent test results  Airway Mallampati: II  TM Distance: >3 FB     Dental   Pulmonary asthma ,    breath sounds clear to auscultation       Cardiovascular hypertension,  Rhythm:Regular Rate:Normal     Neuro/Psych PSYCHIATRIC DISORDERS Anxiety Depression Schizophrenia    GI/Hepatic Neg liver ROS, GERD  ,  Endo/Other  Hypothyroidism   Renal/GU negative Renal ROS     Musculoskeletal  (+) Arthritis ,   Abdominal   Peds  Hematology  (+) anemia ,   Anesthesia Other Findings   Reproductive/Obstetrics                             Anesthesia Physical Anesthesia Plan  ASA: III  Anesthesia Plan: MAC   Post-op Pain Management:    Induction: Intravenous  PONV Risk Score and Plan: 2 and Ondansetron, Dexamethasone and Midazolam  Airway Management Planned: Nasal Cannula and Simple Face Mask  Additional Equipment:   Intra-op Plan:   Post-operative Plan:   Informed Consent: I have reviewed the patients History and Physical, chart, labs and discussed the procedure including the risks, benefits and alternatives for the proposed anesthesia with the patient or authorized representative who has indicated his/her understanding and acceptance.     Dental advisory given  Plan Discussed with: CRNA and Anesthesiologist  Anesthesia Plan Comments:         Anesthesia Quick Evaluation

## 2021-01-03 NOTE — H&P (Signed)
The patient is a 55 year old female who presents with skin lesions. 55 year old female who I have known from previous other issues, presents to office today for evaluation of a sebaceous cyst. This is located in her left upper back. She states that she has had this for approximately 20 years. It occasionally becomes inflamed and swollen. It has been infected, requiring I&D on one occasion as well.   Problem List/Past Medical Leighton Ruff, MD; 1/61/0960 3:54 PM) ANAL FISTULA (K60.3) PROLAPSED INTERNAL HEMORRHOIDS, GRADE 2 (K64.1) PERIANAL ABSCESS (K61.0) SEBACEOUS CYST (L72.3) PERINEAL PAIN (R10.2)  Past Surgical History Leighton Ruff, MD; 4/54/0981 3:54 PM) Breast Biopsy Right. Breast Mass; Local Excision Right. Mammoplasty; Reduction Bilateral. Knee Surgery Bilateral. Shoulder Surgery Left.  Diagnostic Studies History Leighton Ruff, MD; 1/91/4782 3:54 PM) Colonoscopy 1-5 years ago within last year Mammogram within last year 1-3 years ago Pap Smear 1-5 years ago  Allergies Mammie Lorenzo, LPN; 9/56/2130 8:65 PM) Codeine and Related Hydrocodone-Acetaminophen *ANALGESICS - OPIOID* Shellfish-derived Products Allergies Reconciled  Medication History Mammie Lorenzo, LPN; 7/84/6962 9:52 PM) Pantoprazole Sodium (40MG  Tablet DR, Oral) Active. ZyrTEC Allergy (10MG  Tablet, Oral) Active. Benadryl Allergy (25MG  Tablet, Oral) Active. Ferrous Fumarate (324 (106 Fe)MG Tablet, Oral) Active. Flovent (110MCG/ACT Aerosol, Inhalation) Active. Vitamin D (Ergocalciferol) (50000UNIT Capsule, Oral) Active. Ventolin HFA (108 (90 Base)MCG/ACT Aerosol Soln, Inhalation) Active. amLODIPine Besylate (5MG  Tablet, Oral) Active. ARIPiprazole (30MG  Tablet, Oral) Active. Atorvastatin Calcium (20MG  Tablet, Oral) Active. clonazePAM (0.5MG  Tablet, Oral) Active. Fluticasone Propionate (50MCG/ACT Suspension, Nasal) Active. hydroCHLOROthiazide (25MG  Tablet, Oral)  Active. Perphenazine (8MG  Tablet, Oral) Active. Celecoxib (200MG  Capsule, Oral) Active. Synthroid (88MCG Tablet, Oral) Active. Medications Reconciled  Social History Leighton Ruff, MD; 8/41/3244 3:54 PM) Caffeine use Coffee. No caffeine use No alcohol use No drug use Tobacco use Never smoker.  Family History Leighton Ruff, MD; 0/05/2724 3:54 PM) Alcohol Abuse Brother, Sister. Arthritis Father, Mother. Colon Cancer Mother. Heart disease in female family member before age 23 Heart Disease Brother, Father. Heart disease in female family member before age 26 Hypertension Family Members In General, Mother. Kidney Disease Family Members In General, Mother. Ovarian Cancer Mother.  Pregnancy / Birth History Leighton Ruff, MD; 3/66/4403 3:54 PM) Age at menarche 76 years. Age of menopause 11-55 Gravida 3 Irregular periods Maternal age 47-20 Para 3  Other Problems Leighton Ruff, MD; 4/74/2595 3:54 PM) Anxiety Disorder Arthritis Asthma Back Pain Diverticulosis Chest pain Depression Gastroesophageal Reflux Disease Hemorrhoids High blood pressure Hypercholesterolemia Thyroid Disease     Review of Systems Leighton Ruff MD; 6/38/7564 3:54 PM) General Present- Night Sweats. Not Present- Appetite Loss, Chills, Fatigue, Fever, Weight Gain and Weight Loss. Skin Not Present- Change in Wart/Mole, Dryness, Hives, Jaundice, New Lesions, Non-Healing Wounds, Rash and Ulcer. HEENT Present- Wears glasses/contact lenses. Not Present- Earache, Hearing Loss, Hoarseness, Nose Bleed, Oral Ulcers, Ringing in the Ears, Seasonal Allergies, Sinus Pain, Sore Throat, Visual Disturbances and Yellow Eyes. Respiratory Not Present- Bloody sputum, Chronic Cough, Difficulty Breathing, Snoring and Wheezing. Breast Not Present- Breast Mass, Breast Pain, Nipple Discharge and Skin Changes. Cardiovascular Not Present- Chest Pain, Difficulty Breathing Lying Down,  Leg Cramps, Palpitations, Rapid Heart Rate, Shortness of Breath and Swelling of Extremities. Gastrointestinal Not Present- Abdominal Pain, Bloating, Bloody Stool, Change in Bowel Habits, Chronic diarrhea, Constipation, Difficulty Swallowing, Excessive gas, Gets full quickly at meals, Hemorrhoids, Indigestion, Nausea, Rectal Pain and Vomiting. Female Genitourinary Not Present- Frequency, Nocturia, Painful Urination, Pelvic Pain and Urgency. Musculoskeletal Present- Joint Pain. Not Present- Back Pain, Joint Stiffness, Muscle Pain, Muscle Weakness  and Swelling of Extremities. Neurological Not Present- Decreased Memory, Fainting, Headaches, Numbness, Seizures, Tingling, Tremor, Trouble walking and Weakness. Psychiatric Not Present- Anxiety, Bipolar, Change in Sleep Pattern, Depression, Fearful and Frequent crying. Endocrine Present- Hot flashes. Not Present- Cold Intolerance, Excessive Hunger, Hair Changes, Heat Intolerance and New Diabetes. Hematology Not Present- Blood Thinners, Easy Bruising, Excessive bleeding, Gland problems, HIV and Persistent Infections.  BP (!) 161/89   Pulse 77   Temp 98.4 F (36.9 C) (Oral)   Resp 16   Ht 5\' 2"  (1.575 m)   Wt 73.1 kg   LMP  (LMP Unknown) Comment: tubes tied  SpO2 100%   BMI 29.48 kg/m    Physical Exam   General Mental Status-Alert. General Appearance-Cooperative. CV: RRR Lungs: CTA Abd: soft Integumentary Problem #1 Location - Back - left upper(Sebaceous cyst approximately 1 cm in diameter, noninflamed).    Assessment & Plan Leighton Ruff MD; 09/24/863 3:52 PM)  SEBACEOUS CYST (L72.3) Impression: 55 year old female who presents to the office for evaluation of a sebaceous cyst. This becomes inflamed at times. She has had it drained by her dermatologist on one occasion as well. It is currently noninflamed and she is requesting removal. I think this is reasonable. We have discussed proceeding with excisional biopsy. All  questions were answered. Operations include bleeding, infection, and a very small chance of recurrence if the entire cyst was not removed.

## 2021-01-04 ENCOUNTER — Encounter (HOSPITAL_BASED_OUTPATIENT_CLINIC_OR_DEPARTMENT_OTHER): Payer: Self-pay | Admitting: General Surgery

## 2021-01-04 LAB — SURGICAL PATHOLOGY

## 2021-01-14 NOTE — H&P (Signed)
The patient is a 55 year old female who presents with skin lesions. 55 year old female who I have known from previous other issues, presents to office today for evaluation of a sebaceous cyst. This is located in her left upper back. She states that she has had this for approximately 20 years. It occasionally becomes inflamed and swollen. It has been infected, requiring I&D on one occasion as well.   Problem List/Past Medical Leighton Ruff, MD; 1/61/0960 3:54 PM) ANAL FISTULA (K60.3) PROLAPSED INTERNAL HEMORRHOIDS, GRADE 2 (K64.1) PERIANAL ABSCESS (K61.0) SEBACEOUS CYST (L72.3) PERINEAL PAIN (R10.2)  Past Surgical History Leighton Ruff, MD; 4/54/0981 3:54 PM) Breast Biopsy Right. Breast Mass; Local Excision Right. Mammoplasty; Reduction Bilateral. Knee Surgery Bilateral. Shoulder Surgery Left.  Diagnostic Studies History Leighton Ruff, MD; 1/91/4782 3:54 PM) Colonoscopy 1-5 years ago within last year Mammogram within last year 1-3 years ago Pap Smear 1-5 years ago  Allergies Mammie Lorenzo, LPN; 9/56/2130 8:65 PM) Codeine and Related Hydrocodone-Acetaminophen *ANALGESICS - OPIOID* Shellfish-derived Products Allergies Reconciled  Medication History Mammie Lorenzo, LPN; 7/84/6962 9:52 PM) Pantoprazole Sodium (40MG  Tablet DR, Oral) Active. ZyrTEC Allergy (10MG  Tablet, Oral) Active. Benadryl Allergy (25MG  Tablet, Oral) Active. Ferrous Fumarate (324 (106 Fe)MG Tablet, Oral) Active. Flovent (110MCG/ACT Aerosol, Inhalation) Active. Vitamin D (Ergocalciferol) (50000UNIT Capsule, Oral) Active. Ventolin HFA (108 (90 Base)MCG/ACT Aerosol Soln, Inhalation) Active. amLODIPine Besylate (5MG  Tablet, Oral) Active. ARIPiprazole (30MG  Tablet, Oral) Active. Atorvastatin Calcium (20MG  Tablet, Oral) Active. clonazePAM (0.5MG  Tablet, Oral) Active. Fluticasone Propionate (50MCG/ACT Suspension, Nasal) Active. hydroCHLOROthiazide (25MG  Tablet, Oral)  Active. Perphenazine (8MG  Tablet, Oral) Active. Celecoxib (200MG  Capsule, Oral) Active. Synthroid (88MCG Tablet, Oral) Active. Medications Reconciled  Social History Leighton Ruff, MD; 8/41/3244 3:54 PM) Caffeine use Coffee. No caffeine use No alcohol use No drug use Tobacco use Never smoker.  Family History Leighton Ruff, MD; 0/05/2724 3:54 PM) Alcohol Abuse Brother, Sister. Arthritis Father, Mother. Colon Cancer Mother. Heart disease in female family member before age 66 Heart Disease Brother, Father. Heart disease in female family member before age 32 Hypertension Family Members In General, Mother. Kidney Disease Family Members In General, Mother. Ovarian Cancer Mother.  Pregnancy / Birth History Leighton Ruff, MD; 3/66/4403 3:54 PM) Age at menarche 49 years. Age of menopause 18-55 Gravida 3 Irregular periods Maternal age 78-20 Para 3  Other Problems Leighton Ruff, MD; 4/74/2595 3:54 PM) Anxiety Disorder Arthritis Asthma Back Pain Diverticulosis Chest pain Depression Gastroesophageal Reflux Disease Hemorrhoids High blood pressure Hypercholesterolemia Thyroid Disease     Review of Systems Leighton Ruff MD; 6/38/7564 3:54 PM) General Present- Night Sweats. Not Present- Appetite Loss, Chills, Fatigue, Fever, Weight Gain and Weight Loss. Skin Not Present- Change in Wart/Mole, Dryness, Hives, Jaundice, New Lesions, Non-Healing Wounds, Rash and Ulcer. HEENT Present- Wears glasses/contact lenses. Not Present- Earache, Hearing Loss, Hoarseness, Nose Bleed, Oral Ulcers, Ringing in the Ears, Seasonal Allergies, Sinus Pain, Sore Throat, Visual Disturbances and Yellow Eyes. Respiratory Not Present- Bloody sputum, Chronic Cough, Difficulty Breathing, Snoring and Wheezing. Breast Not Present- Breast Mass, Breast Pain, Nipple Discharge and Skin Changes. Cardiovascular Not Present- Chest Pain, Difficulty Breathing Lying Down,  Leg Cramps, Palpitations, Rapid Heart Rate, Shortness of Breath and Swelling of Extremities. Gastrointestinal Not Present- Abdominal Pain, Bloating, Bloody Stool, Change in Bowel Habits, Chronic diarrhea, Constipation, Difficulty Swallowing, Excessive gas, Gets full quickly at meals, Hemorrhoids, Indigestion, Nausea, Rectal Pain and Vomiting. Female Genitourinary Not Present- Frequency, Nocturia, Painful Urination, Pelvic Pain and Urgency. Musculoskeletal Present- Joint Pain. Not Present- Back Pain, Joint Stiffness, Muscle Pain, Muscle Weakness  and Swelling of Extremities. Neurological Not Present- Decreased Memory, Fainting, Headaches, Numbness, Seizures, Tingling, Tremor, Trouble walking and Weakness. Psychiatric Not Present- Anxiety, Bipolar, Change in Sleep Pattern, Depression, Fearful and Frequent crying. Endocrine Present- Hot flashes. Not Present- Cold Intolerance, Excessive Hunger, Hair Changes, Heat Intolerance and New Diabetes. Hematology Not Present- Blood Thinners, Easy Bruising, Excessive bleeding, Gland problems, HIV and Persistent Infections.  BP (!) 161/89   Pulse 77   Temp 98.4 F (36.9 C) (Oral)   Resp 16   Ht 5\' 2"  (1.575 m)   Wt 73.1 kg   LMP  (LMP Unknown) Comment: tubes tied  SpO2 100%   BMI 29.48 kg/m    Physical Exam   General Mental Status-Alert. General Appearance-Cooperative. CV: RRR Lungs: CTA Abd: soft Integumentary Problem #1 Location - Back - left upper(Sebaceous cyst approximately 1 cm in diameter, noninflamed).    Assessment & Plan Leighton Ruff MD; 6/75/9163 3:52 PM)  SEBACEOUS CYST (L72.3) Impression: 55 year old female who presents to the office for evaluation of a sebaceous cyst. This becomes inflamed at times. She has had it drained by her dermatologist on one occasion as well. It is currently noninflamed and she is requesting removal. I think this is reasonable. We have discussed proceeding with excisional biopsy. All  questions were answered. Operations include bleeding, infection, and a very small chance of recurrence if the entire cyst was not removed.

## 2021-01-21 ENCOUNTER — Other Ambulatory Visit: Payer: Self-pay

## 2021-01-21 MED ORDER — ATORVASTATIN CALCIUM 20 MG PO TABS: 20.0000 mg | ORAL_TABLET | Freq: Every day | ORAL | 2 refills | Status: DC

## 2021-02-04 ENCOUNTER — Telehealth: Payer: Self-pay | Admitting: Cardiology

## 2021-02-04 MED ORDER — ATORVASTATIN CALCIUM 20 MG PO TABS: 20.0000 mg | ORAL_TABLET | Freq: Every day | ORAL | 2 refills | Status: DC

## 2021-02-04 NOTE — Telephone Encounter (Signed)
Pt c/o medication issue:  1. Name of Medication: rosuvastatin   2. How are you currently taking this medication (dosage and times per day)? has not been taking it  3. Are you having a reaction (difficulty breathing--STAT)? no  4. What is your medication issue? Patient states she has not been taking rosuvastatin, but has been taking atorvastatin. She states her pharmacy is still filling the rosuvastatin and her doctor at [facility] is asking which she is supposed to be taking.

## 2021-02-04 NOTE — Telephone Encounter (Signed)
Returned call to Pt.  Advised atorvastatin is on her med list.  Sent 2nd message to [facility] pharmacy requesting Pt's med list be corrected.

## 2021-04-03 ENCOUNTER — Other Ambulatory Visit: Payer: Self-pay

## 2021-04-03 ENCOUNTER — Ambulatory Visit
Admission: RE | Admit: 2021-04-03 | Discharge: 2021-04-03 | Disposition: A | Discharge status: Home / Self Care | Payer: Medicare Other | Source: Ambulatory Visit | Attending: Family Medicine | Admitting: Family Medicine

## 2021-04-03 DIAGNOSIS — Z1231 Encounter for screening mammogram for malignant neoplasm of breast: Secondary | ICD-10-CM

## 2021-11-26 NOTE — Progress Notes (Deleted)
?Cardiology Office Note:   ? ?Date:  11/26/2021  ? ?ID:  Joy Cook, DOB [birth year], MRN [id number] ? ?PCP:  Kathyrn Lass, MD ?  ?[facility] Providers ?Cardiologist:  None { ? ?Referring MD: Kathyrn Lass, MD  ? ?History of Present Illness:   ? ?Joy Cook is a 56 y.o. female with a hx of GERD, HTN and HLD who was previously followed by Dr. Meda Coffee who now presents to clinic for follow-up.  ? ?Per review of the record, she underwent a stress test 10/2016 that was normal with no evidence of ischemia or infarct. Holter monitor at that time with infrequent PACs, few PVCs, no pausing. SB>>ST felt to be a normal monitor. Echo from 04/2020 with normal LVEF at 55-60% with no RWMA, G1DD, and no valvular disease. Coronary CTA 04/2020 showed no disease. Ca score 0.  ? ?Today, *** ? ?Past Medical History:  ?Diagnosis Date  ? Arthritis   ? Complication of anesthesia   ? SLOW TO WAKE  ? Diverticulosis of colon   ? Family history of premature CAD   ? GAD (generalized anxiety disorder)   ? GERD (gastroesophageal reflux disease)   ? Hemorrhoids   ? History of diverticulitis of colon 2018  ? History of hyperthyroidism 2003  ? s/p RAI  08-05-2002  ? Hypertension   ? followed by pcp  and cardiology, dr Liane Comber---- (nuclear study 10-24-2016 in epic normal w/ nuclear ef 57%, CCTA 04-19-2020 in epic no obstruction calcium score zero  ? IDA (iron deficiency anemia)   ? MDD (major depressive disorder)   ? Moderate persistent asthma   ? followed by pcp  ? Postablative hypothyroidism   ? followed by pcp--- post RAI 08-05-2002  ? Schizoaffective disorder (HCC)   ? w/ hx chronic auditory hallucinations  ? Sebaceous cyst   ? left upper back  ? Wears glasses   ? ? ?Past Surgical History:  ?Procedure Laterality Date  ? ABDOMINOPLASTY  2010  ? W/ UMBILICAL HERNIA REPAIR  ? BREAST LUMPECTOMY Right 1988  ? per pt benign  ? BREAST REDUCTION SURGERY Bilateral 1998  ? CESAREAN SECTION  x3  last one 1988  ? BILATERAL TUBAL LIGATION WITH LAST  C/S  ? COLONOSCOPY  last one 2013  ? CYST EXCISION Left 01/03/2021  ? Procedure: EXCISIONAL BIOPSY OF SEBACEOUS CYST ON LEFT UPPER BACK;  Surgeon: Leighton Ruff, MD;  Location: [place];  Service: General;  Laterality: Left;  ? DILATATION & CURRETTAGE/HYSTEROSCOPY WITH RESECTOCOPE N/A 05/03/2014  ? Procedure: [place];  Surgeon: Thurnell Lose, MD;  Location: [facility];  Service: Gynecology;  Laterality: N/A;  ? KNEE ARTHROSCOPY Bilateral early 1990s  ? SHOULDER ARTHROSCOPY Left late 1990s  ? ? ?Current Medications: ?No outpatient medications have been marked as taking for the 11/28/21 encounter (Appointment) with Freada Bergeron, MD.  ?  ? ?Allergies:   Shellfish allergy, Codeine, Hydrocodone, and Tandem plus [fefum-fepo-fa-b [id number]]  ? ?Social History  ? ?Socioeconomic History  ? Marital status: Married  ?  Spouse name: Not on file  ? Number of children: Not on file  ? Years of education: Not on file  ? Highest education level: Not on file  ?Occupational History  ? Not on file  ?Tobacco Use  ? Smoking status: Never  ? Smokeless tobacco: Never  ?Vaping Use  ? Vaping Use: Never used  ?Substance and Sexual Activity  ? Alcohol use: No  ? Drug use: Never  ?  Sexual activity: Yes  ?Other Topics Concern  ? Not on file  ?Social History Narrative  ? ** Merged History Encounter **  ?    ? ?Social Determinants of Health  ? ?Financial Resource Strain: Not on file  ?Food Insecurity: Not on file  ?Transportation Needs: Not on file  ?Physical Activity: Not on file  ?Stress: Not on file  ?Social Connections: Not on file  ?  ? ?Family History: ?The patient's ***family history includes Hypertension in an other family member. ? ?ROS:   ?Please see the history of present illness.    ?*** All other systems reviewed and are negative. ? ?EKGs/Labs/Other Studies Reviewed:   ? ?The following studies were reviewed today: ?Coronary CTA 04/2020: ?FINDINGS: ?Image quality:  Excellent. ?  ?Noise artifact is: Limited. ?  ?Coronary Arteries:  Normal coronary origin.  Right dominance. ?  ?Left main: The left main is a large caliber vessel with a normal ?take off from the left coronary cusp that bifurcates to form a left ?anterior descending artery and a left circumflex artery. There is no ?plaque or stenosis. ?  ?Left anterior descending artery: The LAD is patent without evidence ?of plaque or stenosis. The LAD gives off 2 patent diagonal branches. ?  ?Left circumflex artery: The LCX is non-dominant and patent with no ?evidence of plaque or stenosis. The LCX gives off 2 patent obtuse ?marginal branches. ?  ?Right coronary artery: The RCA is dominant with normal take off from ?the right coronary cusp. There is no evidence of plaque or stenosis. ?The RCA terminates as a PDA and right posterolateral branch without ?evidence of plaque or stenosis. ?  ?Right Atrium: Right atrial size is within normal limits. ?  ?Right Ventricle: The right ventricular cavity is within normal ?limits. ?  ?Left Atrium: Left atrial size is normal in size with no left atrial ?appendage filling defect. ?  ?Left Ventricle: The ventricular cavity size is within normal limits. ?There are no stigmata of prior infarction. There is no abnormal ?filling defect. ?  ?Pulmonary arteries: Normal in size without proximal filling defect. ?  ?Pulmonary veins: Normal pulmonary venous drainage. ?  ?Pericardium: Normal thickness with no significant effusion or ?calcium present. ?  ?Cardiac valves: The aortic valve is trileaflet without significant ?calcification. The mitral valve is normal structure without ?significant calcification. ?  ?Aorta: Normal caliber with no significant disease. ?  ?Extra-cardiac findings: See attached radiology report for ?non-cardiac structures. ?  ?IMPRESSION: ?1. Coronary calcium score of 0. ?  ?2. Normal coronary origin with right dominance. ?  ?3. Normal coronary arteries. ?  ?RECOMMENDATIONS: ?1. No  evidence of CAD (0%). Consider non-atherosclerotic causes of ?chest pain. ?Stress test 10/2016: ?  ?Nuclear stress EF: 57%. ?The left ventricular ejection fraction is normal (55-65%). ?There was no ST segment deviation noted during stress. ?The study is normal. ?  ?1. Normal LV systolic function.  ?2. No evidence for ischemia or infarction, normal study.  ?  ?Holter 10/2016: ?  ?Infrequent PACs, few PVCs, no runs. ?No pauses > 2 seconds. ?Sinus bradycardia to sinus tachycardia. ?  ?Normal Holter monitor. ?  ?Echo 04/26/2020: ?  ? ? 1. Left ventricular ejection fraction, by estimation, is 55 to 60%. The  ?left ventricle has normal function. The left ventricle has no regional  ?wall motion abnormalities. Left ventricular diastolic parameters are  ?consistent with Grade I diastolic  ?dysfunction (impaired relaxation).  ? 2. Right ventricular systolic function is normal. The right ventricular  ?size  is normal. Tricuspid regurgitation signal is inadequate for assessing  ?PA pressure.  ? 3. The mitral valve is normal in structure. No evidence of mitral valve  ?regurgitation.  ? 4. The aortic valve is tricuspid. Aortic valve regurgitation is not  ?visualized. No aortic stenosis is present.  ? 5. The inferior vena cava is normal in size with greater than 50%  ?respiratory variability, suggesting right atrial pressure of 3 mmHg.  ?  ? ?EKG:  EKG is *** ordered today.  The ekg ordered today demonstrates *** ? ?Recent Labs: ?01/03/2021: BUN 8; Creatinine, Ser 0.70; Hemoglobin 12.9; Potassium 3.5; Sodium 141  ?Recent Lipid Panel ?   ?Component Value Date/Time  ? CHOL 181 09/25/2020 1058  ? TRIG 85 09/25/2020 1058  ? HDL 67 09/25/2020 1058  ? CHOLHDL 2.7 09/25/2020 1058  ? [place] 98 09/25/2020 1058  ? ? ? ?Risk Assessment/Calculations:   ?{Does this patient have ATRIAL FIBRILLATION?[phone]} ? ?    ? ?Physical Exam:   ? ?VS:  LMP  (LMP Unknown) Comment: tubes tied   ? ?Wt Readings from Last 3 Encounters:  ?01/03/21 161 lb 3.2 oz  (73.1 kg)  ?09/21/20 154 lb 9.6 oz (70.1 kg)  ?04/05/20 151 lb 6.4 oz (68.7 kg)  ?  ? ?GEN: *** Well nourished, well developed in no acute distress ?HEENT: Normal ?NECK: No JVD; No carotid bruits ?LYMPH

## 2021-11-28 ENCOUNTER — Encounter: Payer: Self-pay | Admitting: Cardiology

## 2021-11-28 ENCOUNTER — Ambulatory Visit (INDEPENDENT_AMBULATORY_CARE_PROVIDER_SITE_OTHER): Payer: Medicare Other | Admitting: Cardiology

## 2021-11-28 VITALS — BP 126/82 | HR 70 | Ht 62.0 in | Wt 169.6 lb

## 2021-11-28 DIAGNOSIS — Z79899 Other long term (current) drug therapy: Secondary | ICD-10-CM | POA: Diagnosis not present

## 2021-11-28 DIAGNOSIS — R079 Chest pain, unspecified: Secondary | ICD-10-CM

## 2021-11-28 DIAGNOSIS — Z8249 Family history of ischemic heart disease and other diseases of the circulatory system: Secondary | ICD-10-CM

## 2021-11-28 DIAGNOSIS — E785 Hyperlipidemia, unspecified: Secondary | ICD-10-CM | POA: Diagnosis not present

## 2021-11-28 DIAGNOSIS — I10 Essential (primary) hypertension: Secondary | ICD-10-CM | POA: Diagnosis not present

## 2021-11-28 LAB — LIPID PANEL
Chol/HDL Ratio: 2.4 ratio (ref 0.0–4.4)
Cholesterol, Total: 129 mg/dL (ref 100–199)
HDL: 53 mg/dL (ref >39)
LDL Chol Calc (NIH): 63 mg/dL (ref 0–99)
Triglycerides: 59 mg/dL (ref 0–149)
VLDL Cholesterol Cal: 13 mg/dL (ref 5–40)

## 2021-11-28 NOTE — Progress Notes (Signed)
?Cardiology Office Note:   ? ?Date:  11/28/2021  ? ?ID:  Joy Cook, DOB 21-Feb-1966, MRN [id number] ? ?PCP:  Kathyrn Lass, MD ?  ?[facility] Providers ?Cardiologist:  None { ? ?Referring MD: Kathyrn Lass, MD  ? ?History of Present Illness:   ? ?Joy Cook is a 56 y.o. female with a hx of GERD, HTN and HLD who was previously followed by Dr. Meda Coffee who now presents to clinic for follow-up.  ? ?Per review of the record, she underwent a stress test 10/2016 that was normal with no evidence of ischemia or infarct. Holter monitor at that time with infrequent PACs, few PVCs, no pausing. SB>>ST felt to be a normal monitor. Echo from 04/2020 with normal LVEF at 55-60% with no RWMA, G1DD, and no valvular disease. Coronary CTA 04/2020 showed no disease. Ca score 0.  ? ?She is accompanied by her husband. Today, the patient states that she is feeling good. She denies any chest pain. ? ?At home her blood pressure has been well controlled, similar or better to today's in clinic reading 126/82. They endorse some white coat syndrome. ? ?Lately she has not been formally exercising or staying very active. When she is active she generally feels well. ? ?Regarding her diet she usually avoids sodas. They will use Xylitol sweeteners. ? ?She denies any palpitations, shortness of breath, or peripheral edema. No lightheadedness, headaches, syncope, orthopnea, or PND. ? ?She recently followed up with her PCP for an annual visit. Reportedly, her A1C in March 2023 was 6.6. ? ?Past Medical History:  ?Diagnosis Date  ? Arthritis   ? Complication of anesthesia   ? SLOW TO WAKE  ? Diverticulosis of colon   ? Family history of premature CAD   ? GAD (generalized anxiety disorder)   ? GERD (gastroesophageal reflux disease)   ? Hemorrhoids   ? History of diverticulitis of colon 2018  ? History of hyperthyroidism 2003  ? s/p RAI  08-05-2002  ? Hypertension   ? followed by pcp  and cardiology, dr Liane Comber---- (nuclear study 10-24-2016 in epic  normal w/ nuclear ef 57%, CCTA 04-19-2020 in epic no obstruction calcium score zero  ? IDA (iron deficiency anemia)   ? MDD (major depressive disorder)   ? Moderate persistent asthma   ? followed by pcp  ? Postablative hypothyroidism   ? followed by pcp--- post RAI 08-05-2002  ? Schizoaffective disorder (HCC)   ? w/ hx chronic auditory hallucinations  ? Sebaceous cyst   ? left upper back  ? Wears glasses   ? ? ?Past Surgical History:  ?Procedure Laterality Date  ? ABDOMINOPLASTY  2010  ? W/ UMBILICAL HERNIA REPAIR  ? BREAST LUMPECTOMY Right 1988  ? per pt benign  ? BREAST REDUCTION SURGERY Bilateral 1998  ? CESAREAN SECTION  x3  last one 1988  ? BILATERAL TUBAL LIGATION WITH LAST C/S  ? COLONOSCOPY  last one 2013  ? CYST EXCISION Left 01/03/2021  ? Procedure: EXCISIONAL BIOPSY OF SEBACEOUS CYST ON LEFT UPPER BACK;  Surgeon: Leighton Ruff, MD;  Location: [place];  Service: General;  Laterality: Left;  ? DILATATION & CURRETTAGE/HYSTEROSCOPY WITH RESECTOCOPE N/A 05/03/2014  ? Procedure: [place];  Surgeon: Thurnell Lose, MD;  Location: [facility];  Service: Gynecology;  Laterality: N/A;  ? KNEE ARTHROSCOPY Bilateral early 1990s  ? SHOULDER ARTHROSCOPY Left late 1990s  ? ? ?Current Medications: ?Current Meds  ?Medication Sig  ? acetaminophen (TYLENOL) 500  MG tablet Take 1,000 mg by mouth every 8 (eight) hours as needed.  ? Albuterol Sulfate (PROAIR RESPICLICK) 546 (90 Base) MCG/ACT AEPB Inhale 2 puffs into the lungs every 6 (six) hours as needed (shortness of breath/wheezing).  ? amLODipine (NORVASC) 5 MG tablet Take 5 mg by mouth daily.  ? ARIPiprazole (ABILIFY) 30 MG tablet Take 30 mg by mouth daily.  ? atorvastatin (LIPITOR) 20 MG tablet Take 1 tablet (20 mg total) by mouth daily.  ? cetirizine (ZYRTEC) 10 MG tablet Take 10 mg by mouth daily.  ? Cholecalciferol (VITAMIN D) 50 MCG (2000 UT) tablet Take 2,000 Units by mouth daily.   ? clonazePAM (KLONOPIN) 0.5  MG tablet Take 0.5 mg by mouth at bedtime as needed (sleep).   ? Ferrous Sulfate 27 MG TABS Take 27 mg by mouth daily.   ? fluticasone (FLONASE) 50 MCG/ACT nasal spray Place 1 spray into both nostrils daily at 12 noon.   ? fluticasone (FLOVENT HFA) 110 MCG/ACT inhaler Inhale 1 puff into the lungs 2 (two) times daily.   ? hydrochlorothiazide (HYDRODIURIL) 25 MG tablet Take 25 mg by mouth daily.  ? levalbuterol (XOPENEX) 1.25 MG/3ML nebulizer solution Take 1.25 mg by nebulization every 4 (four) hours as needed for wheezing.  ? levothyroxine (SYNTHROID, LEVOTHROID) 100 MCG tablet Take 100 mcg by mouth daily before breakfast.  ? naloxone (NARCAN) nasal spray 4 mg/0.1 mL Narcan 4 mg/actuation nasal spray ? TAKE BY NASAL ROUTE EVERY 3 MINUTES UNTIL PATIENT AWAKES OR EMS ARRIVES.  ? OVER THE COUNTER MEDICATION Apply 1 application topically See admin instructions. CRAMER ANALGESIC CREAM - apply topically 5 times daily prn pain  ? pantoprazole (PROTONIX) 40 MG tablet Take 40 mg by mouth at bedtime.  ? perphenazine (TRILAFON) 8 MG tablet Take 4-8 mg by mouth See admin instructions. Take one tablet (8 mg) by mouth twice daily, take 1/2 tablet (4 mg) at bedtime  ? traMADol (ULTRAM) 50 MG tablet Take 50 mg by mouth 2 (two) times daily as needed (pain).   ? triamcinolone cream (KENALOG) 0.1 % Apply 1 application topically 2 (two) times daily as needed (itching).  ?  ? ?Allergies:   Shellfish allergy, Codeine, Hydrocodone, and Tandem plus [fefum-fepo-fa-b [id number]]  ? ?Social History  ? ?Socioeconomic History  ? Marital status: Married  ?  Spouse name: Not on file  ? Number of children: Not on file  ? Years of education: Not on file  ? Highest education level: Not on file  ?Occupational History  ? Not on file  ?Tobacco Use  ? Smoking status: Never  ? Smokeless tobacco: Never  ?Vaping Use  ? Vaping Use: Never used  ?Substance and Sexual Activity  ? Alcohol use: No  ? Drug use: Never  ? Sexual activity: Yes  ?Other Topics  Concern  ? Not on file  ?Social History Narrative  ? ** Merged History Encounter **  ?    ? ?Social Determinants of Health  ? ?Financial Resource Strain: Not on file  ?Food Insecurity: Not on file  ?Transportation Needs: Not on file  ?Physical Activity: Not on file  ?Stress: Not on file  ?Social Connections: Not on file  ?  ? ?Family History: ?The patient's family history includes Hypertension in an other family member. ? ?ROS:   ?Review of Systems  ?Constitutional:  Negative for chills.  ?HENT:  Negative for sinus pain and tinnitus.   ?Eyes:  Negative for double vision.  ?Respiratory:  Negative for cough and  shortness of breath.   ?Cardiovascular:  Negative for chest pain, palpitations, orthopnea, claudication, leg swelling and PND.  ?Gastrointestinal:  Negative for diarrhea and heartburn.  ?Genitourinary:  Negative for flank pain.  ?Musculoskeletal:  Negative for back pain.  ?Neurological:  Negative for tremors and loss of consciousness.  ?Endo/Heme/Allergies:  Does not bruise/bleed easily.  ?Psychiatric/Behavioral:  Negative for depression and suicidal ideas.   ? ? ?EKGs/Labs/Other Studies Reviewed:   ? ?The following studies were reviewed today: ? ?Echo 04/26/2020: ? 1. Left ventricular ejection fraction, by estimation, is 55 to 60%. The  ?left ventricle has normal function. The left ventricle has no regional  ?wall motion abnormalities. Left ventricular diastolic parameters are  ?consistent with Grade I diastolic  ?dysfunction (impaired relaxation).  ? 2. Right ventricular systolic function is normal. The right ventricular  ?size is normal. Tricuspid regurgitation signal is inadequate for assessing  ?PA pressure.  ? 3. The mitral valve is normal in structure. No evidence of mitral valve  ?regurgitation.  ? 4. The aortic valve is tricuspid. Aortic valve regurgitation is not  ?visualized. No aortic stenosis is present.  ? 5. The inferior vena cava is normal in size with greater than 50%  ?respiratory variability,  suggesting right atrial pressure of 3 mmHg.  ? ?Coronary CTA 04/2020: ?FINDINGS: ?Image quality: Excellent. ?  ?Noise artifact is: Limited. ?  ?Coronary Arteries:  Normal coronary origin.  Right dominance.

## 2021-11-28 NOTE — Patient Instructions (Signed)
Medication Instructions:  ? ?Your physician recommends that you continue on your current medications as directed. Please refer to the Current Medication list given to you today. ? ?*If you need a refill on your cardiac medications before your next appointment, please call your pharmacy* ? ? ?Lab Work: ? ?TODAY--LIPIDS ? ?If you have labs (blood work) drawn today and your tests are completely normal, you will receive your results only by: ?MyChart Message (if you have MyChart) OR ?A paper copy in the mail ?If you have any lab test that is abnormal or we need to change your treatment, we will call you to review the results. ? ? ?Follow-Up: ?At [facility], you and your health needs are our priority.  As part of our continuing mission to provide you with exceptional heart care, we have created designated Provider Care Teams.  These Care Teams include your primary Cardiologist (physician) and Advanced Practice Providers (APPs -  Physician Assistants and Nurse Practitioners) who all work together to provide you with the care you need, when you need it. ? ?We recommend signing up for the patient portal called "MyChart".  Sign up information is provided on this After Visit Summary.  MyChart is used to connect with patients for Virtual Visits (Telemedicine).  Patients are able to view lab/test results, encounter notes, upcoming appointments, etc.  Non-urgent messages can be sent to your provider as well.   ?To learn more about what you can do with MyChart, go to [email or web address].   ? ?Your next appointment:   ?6 month(s) ? ?The format for your next appointment:   ?In Person ? ?Provider:   ?DR. PEMBERTON  ? ?Important Information About Sugar ? ? ? ? ? ? ?

## 2021-12-10 DIAGNOSIS — M75120 Complete rotator cuff tear or rupture of unspecified shoulder, not specified as traumatic: Secondary | ICD-10-CM | POA: Insufficient documentation

## 2021-12-11 DIAGNOSIS — Z4789 Encounter for other orthopedic aftercare: Secondary | ICD-10-CM | POA: Insufficient documentation

## 2022-01-09 ENCOUNTER — Other Ambulatory Visit: Payer: Self-pay | Admitting: Cardiology

## 2022-01-22 ENCOUNTER — Ambulatory Visit
Admission: RE | Admit: 2022-01-22 | Discharge: 2022-01-22 | Disposition: A | Discharge status: Home / Self Care | Payer: Medicare Other | Source: Ambulatory Visit | Attending: Family Medicine | Admitting: Family Medicine

## 2022-01-22 ENCOUNTER — Other Ambulatory Visit: Payer: Self-pay | Admitting: Family Medicine

## 2022-01-22 DIAGNOSIS — R059 Cough, unspecified: Secondary | ICD-10-CM

## 2022-02-20 ENCOUNTER — Encounter: Payer: Self-pay | Admitting: Family Medicine

## 2022-02-20 ENCOUNTER — Ambulatory Visit (INDEPENDENT_AMBULATORY_CARE_PROVIDER_SITE_OTHER): Payer: Medicare Other | Admitting: Family Medicine

## 2022-02-20 ENCOUNTER — Ambulatory Visit: Payer: Self-pay

## 2022-02-20 VITALS — BP 118/82 | HR 77 | Ht 62.0 in | Wt 172.0 lb

## 2022-02-20 DIAGNOSIS — M75101 Unspecified rotator cuff tear or rupture of right shoulder, not specified as traumatic: Secondary | ICD-10-CM | POA: Insufficient documentation

## 2022-02-20 DIAGNOSIS — M25511 Pain in right shoulder: Secondary | ICD-10-CM | POA: Diagnosis not present

## 2022-02-20 DIAGNOSIS — M75111 Incomplete rotator cuff tear or rupture of right shoulder, not specified as traumatic: Secondary | ICD-10-CM

## 2022-02-20 NOTE — Progress Notes (Signed)
[place] [facility] [place] Phone: [phone] Subjective:   Fontaine No, am serving as a scribe for Dr. Hulan Saas.   I'm seeing this patient by the request  of:  Kathyrn Lass, MD  CC: Right shoulder pain  HCW:[id number]  Joy Cook is a 56 y.o. female coming in with complaint of R shoulder pain. Patient states that she has had pain for years. She has pain with all motions and has had numbness that radiates into the R hand.  Pain over middle deltoid and superior shoulder.    Outside records  [place], her husband, and I discussed the issues noted on exam and correlated that with the MRI. Do believe she is dealing with symptomatic rotator cuff tear, symptomatic proximal biceps tendinopathy as well as symptomatic AC joint arthritis and subacromial impingement. She has had exhaustive conservative measures at this point I would recommend arthroscopic rotator cuff repair with biceps tenodesis, subacromial decompression, and AC joint debridement and distal clavicle resection. We discussed the risk and benefits of the procedure in detail. We discussed the risk of bleeding, infection, damage to surrounding nerves and vessels, fracture, dislocation, stiffness, failure of repairs, and the risk of anesthesia. They have provided informed consent.    Patient did bring the MRI in today.  Patient has had 3 MRIs dating back from 2019, 2022 and 2023 showing some progression of the interstitial tearing with now a near high-grade tear of the supraspinatus with a type II acromion noted.  This was independently visualized by me today.  Past Medical History:  Diagnosis Date   Arthritis    Complication of anesthesia    SLOW TO WAKE   Diverticulosis of colon    Family history of premature CAD    GAD (generalized anxiety disorder)    GERD (gastroesophageal reflux disease)    Hemorrhoids    History of diverticulitis of colon 2018   History of  hyperthyroidism 2003   s/p RAI  08-05-2002   Hypertension    followed by pcp  and cardiology, dr Liane Comber---- (nuclear study 10-24-2016 in epic normal w/ nuclear ef 57%, CCTA 04-19-2020 in epic no obstruction calcium score zero   IDA (iron deficiency anemia)    MDD (major depressive disorder)    Moderate persistent asthma    followed by pcp   Postablative hypothyroidism    followed by pcp--- post RAI 08-05-2002   Schizoaffective disorder ([place])    w/ hx chronic auditory hallucinations   Sebaceous cyst    left upper back   Wears glasses    Past Surgical History:  Procedure Laterality Date   ABDOMINOPLASTY  7616   W/ UMBILICAL HERNIA REPAIR   BREAST LUMPECTOMY Right 1988   per pt benign   BREAST REDUCTION SURGERY Bilateral 1998   CESAREAN SECTION  x3  last one [place] C/S   COLONOSCOPY  last one 2013   CYST EXCISION Left 01/03/2021   Procedure: EXCISIONAL BIOPSY OF SEBACEOUS CYST ON LEFT UPPER BACK;  Surgeon: Leighton Ruff, MD;  Location: [facility];  Service: General;  Laterality: Left;   DILATATION & CURRETTAGE/HYSTEROSCOPY WITH RESECTOCOPE N/A 05/03/2014   Procedure: DILATATION & CURETTAGE/HYSTEROSCOPY WITH RESECTOCOPE;  Surgeon: Thurnell Lose, MD;  Location: [facility];  Service: Gynecology;  Laterality: N/A;   KNEE ARTHROSCOPY Bilateral early 1990s   SHOULDER ARTHROSCOPY Left late 1990s   Social History   Socioeconomic History  Marital status: Married    Spouse name: Not on file   Number of children: Not on file   Years of education: Not on file   Highest education level: Not on file  Occupational History   Not on file  Tobacco Use   Smoking status: Never   Smokeless tobacco: Never  Vaping Use   Vaping Use: Never used  Substance and Sexual Activity   Alcohol use: No   Drug use: Never   Sexual activity: Yes  Other Topics Concern   Not on file  Social History Narrative   ** Merged History Encounter **        Social Determinants of Health   Financial Resource Strain: Not on file  Food Insecurity: Not on file  Transportation Needs: Not on file  Physical Activity: Not on file  Stress: Not on file  Social Connections: Not on file   Allergies  Allergen Reactions   Shellfish Allergy Anaphylaxis, Hives and Itching   Codeine Hives and Nausea Only    Fatigue, dizziness, and nausea     Hydrocodone Nausea Only    Dizziness    Tandem Plus [Fefum-Fepo-Fa-B Cmp-C-Zn-Mn-Cu] Nausea And Vomiting and Other (See Comments)    Dizzy and fatigue    Family History  Problem Relation Age of Onset   Hypertension Other     Current Outpatient Medications (Endocrine & Metabolic):    levothyroxine (SYNTHROID, LEVOTHROID) 100 MCG tablet, Take 100 mcg by mouth daily before breakfast.  Current Outpatient Medications (Cardiovascular):    amLODipine (NORVASC) 5 MG tablet, Take 5 mg by mouth daily.   atorvastatin (LIPITOR) 20 MG tablet, TAKE 1 TABLET BY MOUTH EVERY DAY   hydrochlorothiazide (HYDRODIURIL) 25 MG tablet, Take 25 mg by mouth daily.  Current Outpatient Medications (Respiratory):    Albuterol Sulfate (PROAIR RESPICLICK) 295 (90 Base) MCG/ACT AEPB, Inhale 2 puffs into the lungs every 6 (six) hours as needed (shortness of breath/wheezing).   cetirizine (ZYRTEC) 10 MG tablet, Take 10 mg by mouth daily.   fluticasone (FLONASE) 50 MCG/ACT nasal spray, Place 1 spray into both nostrils daily at 12 noon.    fluticasone (FLOVENT HFA) 110 MCG/ACT inhaler, Inhale 1 puff into the lungs 2 (two) times daily.    levalbuterol (XOPENEX) 1.25 MG/3ML nebulizer solution, Take 1.25 mg by nebulization every 4 (four) hours as needed for wheezing.  Current Outpatient Medications (Analgesics):    acetaminophen (TYLENOL) 500 MG tablet, Take 1,000 mg by mouth every 8 (eight) hours as needed.   traMADol (ULTRAM) 50 MG tablet, Take 50 mg by mouth 2 (two) times daily as needed (pain).   Current Outpatient Medications  (Hematological):    Ferrous Sulfate 27 MG TABS, Take 27 mg by mouth daily.   Current Outpatient Medications (Other):    ARIPiprazole (ABILIFY) 30 MG tablet, Take 30 mg by mouth daily.   Cholecalciferol (VITAMIN D) 50 MCG (2000 UT) tablet, Take 2,000 Units by mouth daily.    clonazePAM (KLONOPIN) 0.5 MG tablet, Take 0.5 mg by mouth at bedtime as needed (sleep).    magnesium oxide (MAG-OX) 400 MG tablet, Take 400 mg by mouth 2 (two) times daily.   naloxone (NARCAN) nasal spray 4 mg/0.1 mL, Narcan 4 mg/actuation nasal spray  TAKE BY NASAL ROUTE EVERY 3 MINUTES UNTIL PATIENT AWAKES OR EMS ARRIVES.   OVER THE COUNTER MEDICATION, Apply 1 application topically See admin instructions. CRAMER ANALGESIC CREAM - apply topically 5 times daily prn pain   pantoprazole (PROTONIX) 40 MG tablet, Take  40 mg by mouth at bedtime.   perphenazine (TRILAFON) 8 MG tablet, Take 4-8 mg by mouth See admin instructions. Take one tablet (8 mg) by mouth twice daily, take 1/2 tablet (4 mg) at bedtime   triamcinolone cream (KENALOG) 0.1 %, Apply 1 application topically 2 (two) times daily as needed (itching).   Reviewed prior external information including notes and imaging from  primary care provider As well as notes that were available from care everywhere and other healthcare systems.  Past medical history, social, surgical and family history all reviewed in electronic medical record.  No pertanent information unless stated regarding to the chief complaint.   Review of Systems:  No headache, visual changes, nausea, vomiting, diarrhea, constipation, dizziness, abdominal pain, skin rash, fevers, chills, night sweats, weight loss, swollen lymph nodes, body aches, joint swelling, chest pain, shortness of breath, mood changes. POSITIVE muscle aches  Objective  Blood pressure 118/82, pulse 77, height '5\' 2"'$  (1.575 m), weight 172 lb (78 kg), SpO2 99 %.   General: No apparent distress alert and oriented x3 mood and affect  normal, dressed appropriately.  HEENT: Pupils equal, extraocular movements intact  Respiratory: Patient's speak in full sentences and does not appear short of breath  Cardiovascular: No lower extremity edema, non tender, no erythema  Right shoulder exam the patient does move it very cautiously.  Positive impingement with Neer and Hawkins.  Rotator cuff strength though does appear to be strong with 4+ out of 5 strength compared to the contralateral side of 5 out of 5.  Patient does have limited range of motion but seems to be secondary to voluntary guarding.  Limited muscular skeletal ultrasound was performed and interpreted by Hulan Saas, M Limited ultrasound shows the patient does have some hypoechoic changes within the supraspinatus tendon near the insertion.  Patient does have some mild increase in neovascularization.  Patient does not have any cortical defect and does not seem to have any retraction.  We will downward sloping acromion noted and does show impingement on active movement. Impression: Rotator cuff tear consistent with MRI    Impression and Recommendations:    The above documentation has been reviewed and is accurate and complete Lyndal Pulley, DO

## 2022-02-20 NOTE — Assessment & Plan Note (Signed)
Patient on MRI and ultrasound is consistent with a partial tearing of the rotator cuff.  Patient supraspinatus does have what appears to be a potential for a high-grade tear noted.  We did discuss the possibility of PRP.  The only concerning aspect is the downsloping of the acromion that could increase patient's risk of impingement.  Because of this patient has decided that she would like to reexplore the possibility of surgical intervention.  We discussed if she has any other change in her mind she can come back and we can consider PRP but otherwise patient will move forward with surgery.

## 2022-02-20 NOTE — Patient Instructions (Signed)
Write [place] when you know the date of surgery Write me if you have questions

## 2022-03-07 ENCOUNTER — Ambulatory Visit: Payer: Medicare Other | Admitting: Family Medicine

## 2022-03-12 ENCOUNTER — Other Ambulatory Visit: Payer: Self-pay | Admitting: Family Medicine

## 2022-03-12 DIAGNOSIS — Z1231 Encounter for screening mammogram for malignant neoplasm of breast: Secondary | ICD-10-CM

## 2022-03-13 ENCOUNTER — Encounter: Payer: Self-pay | Admitting: Family Medicine

## 2022-04-04 ENCOUNTER — Ambulatory Visit
Admission: RE | Admit: 2022-04-04 | Discharge: 2022-04-04 | Disposition: A | Discharge status: Home / Self Care | Payer: Medicare Other | Source: Ambulatory Visit | Attending: Family Medicine | Admitting: Family Medicine

## 2022-04-04 DIAGNOSIS — Z1231 Encounter for screening mammogram for malignant neoplasm of breast: Secondary | ICD-10-CM

## 2022-05-25 NOTE — Progress Notes (Unsigned)
Cardiology Office Note:    Date:  05/25/2022   ID:  Joy Cook, DOB [birth year], MRN [id number]  PCP:  Kathyrn Lass, MD   [facility] Providers Cardiologist:  None {  Referring MD: Kathyrn Lass, MD   History of Present Illness:    Joy Cook is a 56 y.o. female with a hx of GERD, HTN and HLD who was previously followed by Dr. Meda Cook who now presents to clinic for follow-up.   Per review of the record, she underwent a stress test 10/2016 that was normal with no evidence of ischemia or infarct. Holter monitor at that time with infrequent PACs, few PVCs, no pausing. SB>>ST felt to be a normal monitor. Echo from 04/2020 with normal LVEF at 55-60% with no RWMA, G1DD, and no valvular disease. Coronary CTA 04/2020 showed no disease. Ca score 0.   Was last seen in clinic on 11/28/21 where she was doing very well from a CV standpoint.  Today, ***  Past Medical History:  Diagnosis Date   Arthritis    Complication of anesthesia    SLOW TO WAKE   Diverticulosis of colon    Family history of premature CAD    GAD (generalized anxiety disorder)    GERD (gastroesophageal reflux disease)    Hemorrhoids    History of diverticulitis of colon 2018   History of hyperthyroidism 2003   s/p RAI  08-05-2002   Hypertension    followed by pcp  and cardiology, dr Joy Cook---- (nuclear study 10-24-2016 in epic normal w/ nuclear ef 57%, CCTA 04-19-2020 in epic no obstruction calcium score zero   IDA (iron deficiency anemia)    MDD (major depressive disorder)    Moderate persistent asthma    followed by pcp   Postablative hypothyroidism    followed by pcp--- post RAI 08-05-2002   Schizoaffective disorder ([place])    w/ hx chronic auditory hallucinations   Sebaceous cyst    left upper back   Wears glasses     Past Surgical History:  Procedure Laterality Date   ABDOMINOPLASTY  7062   W/ UMBILICAL HERNIA REPAIR   BREAST LUMPECTOMY Right 1988   per pt benign   BREAST REDUCTION SURGERY  Bilateral 1998   CESAREAN SECTION  x3  last one [place] C/S   COLONOSCOPY  last one 2013   CYST EXCISION Left 01/03/2021   Procedure: EXCISIONAL BIOPSY OF SEBACEOUS CYST ON LEFT UPPER BACK;  Surgeon: Leighton Ruff, MD;  Location: [facility];  Service: General;  Laterality: Left;   [place] N/A 05/03/2014   Procedure: [place];  Surgeon: Thurnell Lose, MD;  Location: [facility];  Service: Gynecology;  Laterality: N/A;   KNEE ARTHROSCOPY Bilateral early 1990s   REDUCTION MAMMAPLASTY     SHOULDER ARTHROSCOPY Left late 1990s    Current Medications: No outpatient medications have been marked as taking for the 05/28/22 encounter (Appointment) with Joy Bergeron, MD.     Allergies:   Shellfish allergy, Codeine, Hydrocodone, and Tandem plus [fefum-fepo-fa-b cmp-c-zn-mn-cu]   Social History   Socioeconomic History   Marital status: Married    Spouse name: Not on file   Number of children: Not on file   Years of education: Not on file   Highest education level: Not on file  Occupational History   Not on file  Tobacco Use   Smoking status: Never   Smokeless tobacco: Never  Vaping Use   Vaping Use: Never used  Substance and Sexual Activity   Alcohol use: No   Drug use: Never   Sexual activity: Yes  Other Topics Concern   Not on file  Social History Narrative   ** Merged History Encounter **       Social Determinants of Health   Financial Resource Strain: Not on file  Food Insecurity: Not on file  Transportation Needs: Not on file  Physical Activity: Not on file  Stress: Not on file  Social Connections: Not on file     Family History: The patient's family history includes Hypertension in an other family member.  ROS:   Review of Systems  Constitutional:  Negative for chills.  HENT:  Negative for sinus pain and tinnitus.   Eyes:   Negative for double vision.  Respiratory:  Negative for cough and shortness of breath.   Cardiovascular:  Negative for chest pain, palpitations, orthopnea, claudication, leg swelling and PND.  Gastrointestinal:  Negative for diarrhea and heartburn.  Genitourinary:  Negative for flank pain.  Musculoskeletal:  Negative for back pain.  Neurological:  Negative for tremors and loss of consciousness.  Endo/Heme/Allergies:  Does not bruise/bleed easily.  Psychiatric/Behavioral:  Negative for depression and suicidal ideas.      EKGs/Labs/Other Studies Reviewed:    The following studies were reviewed today:  Echo 04/26/2020:  1. Left ventricular ejection fraction, by estimation, is 55 to 60%. The  left ventricle has normal function. The left ventricle has no regional  wall motion abnormalities. Left ventricular diastolic parameters are  consistent with Grade I diastolic  dysfunction (impaired relaxation).   2. Right ventricular systolic function is normal. The right ventricular  size is normal. Tricuspid regurgitation signal is inadequate for assessing  PA pressure.   3. The mitral valve is normal in structure. No evidence of mitral valve  regurgitation.   4. The aortic valve is tricuspid. Aortic valve regurgitation is not  visualized. No aortic stenosis is present.   5. The inferior vena cava is normal in size with greater than 50%  respiratory variability, suggesting right atrial pressure of 3 mmHg.   Coronary CTA 04/2020: FINDINGS: Image quality: Excellent.   Noise artifact is: Limited.   Coronary Arteries:  Normal coronary origin.  Right dominance.   Left main: The left main is a large caliber vessel with a normal take off from the left coronary cusp that bifurcates to form a left anterior descending artery and a left circumflex artery. There is no plaque or stenosis.   Left anterior descending artery: The LAD is patent without evidence of plaque or stenosis. The LAD gives  off 2 patent diagonal branches.   Left circumflex artery: The LCX is non-dominant and patent with no evidence of plaque or stenosis. The LCX gives off 2 patent obtuse marginal branches.   Right coronary artery: The RCA is dominant with normal take off from the right coronary cusp. There is no evidence of plaque or stenosis. The RCA terminates as a PDA and right posterolateral branch without evidence of plaque or stenosis.   Right Atrium: Right atrial size is within normal limits.   Right Ventricle: The right ventricular cavity is within normal limits.   Left Atrium: Left atrial size is normal in size with no left atrial appendage filling defect.   Left Ventricle: The ventricular cavity size is within normal limits. There are no stigmata of prior infarction. There is no abnormal filling defect.   Pulmonary arteries:  Normal in size without proximal filling defect.   Pulmonary veins: Normal pulmonary venous drainage.   Pericardium: Normal thickness with no significant effusion or calcium present.   Cardiac valves: The aortic valve is trileaflet without significant calcification. The mitral valve is normal structure without significant calcification.   Aorta: Normal caliber with no significant disease.   Extra-cardiac findings: See attached radiology report for non-cardiac structures.   IMPRESSION: 1. Coronary calcium score of 0.   2. Normal coronary origin with right dominance.   3. Normal coronary arteries.   RECOMMENDATIONS: 1. No evidence of CAD (0%). Consider non-atherosclerotic causes of chest pain.  Stress test 10/2016:   Nuclear stress EF: 57%. The left ventricular ejection fraction is normal (55-65%). There was no ST segment deviation noted during stress. The study is normal.   1. Normal LV systolic function.  2. No evidence for ischemia or infarction, normal study.    Holter 10/2016:   Infrequent PACs, few PVCs, no runs. No pauses > 2 seconds. Sinus  bradycardia to sinus tachycardia.   Normal Holter monitor.    EKG:  EKG is personally reviewed. 11/28/2021: Sinus rhythm. Rate 70 bpm.  Recent Labs: No results found for requested labs within last 365 days.   Recent Lipid Panel    Component Value Date/Time   CHOL 129 11/28/2021 1057   TRIG 59 11/28/2021 1057   HDL 53 11/28/2021 1057   CHOLHDL 2.4 11/28/2021 1057   LDLCALC 63 11/28/2021 1057     Risk Assessment/Calculations:           Physical Exam:    VS:  LMP  (LMP Unknown) Comment: tubes tied    Wt Readings from Last 3 Encounters:  02/20/22 172 lb (78 kg)  11/28/21 169 lb 9.6 oz (76.9 kg)  01/03/21 161 lb 3.2 oz (73.1 kg)     GEN: Well nourished, well developed in no acute distress HEENT: Normal NECK: No JVD; No carotid bruits LYMPHATICS: No lymphadenopathy CARDIAC: RRR, no murmurs, rubs, gallops RESPIRATORY:  Clear to auscultation without rales, wheezing or rhonchi  ABDOMEN: Soft, non-tender, non-distended MUSCULOSKELETAL:  No edema; No deformity  SKIN: Warm and dry NEUROLOGIC:  Alert and oriented x 3 PSYCHIATRIC:  Normal affect   ASSESSMENT:    No diagnosis found.  PLAN:    In order of problems listed above:  #Chest Pain: Coronary CTA negative for obstructive disease. TTE with normal BiV function, G1DD. Doing well.   #HLD: -Continue lipitor '20mg'$  daily -LDL well controlled at 63 (11/2021) -Goal LDL<100; Ca score 0   #HTN: -Continue amlodipine '5mg'$  daily -Continue HCTZ '25mg'$  daily   #DMII: A1C 6.6 at PCP office. Working of lifestyle modifications.   Exercise recommendations: Goal of exercising for at least 30 minutes a day, at least 5 times per week.  Please exercise to a moderate exertion.  This means that while exercising it is difficult to speak in full sentences, however you are not so short of breath that you feel you must stop, and not so comfortable that you can carry on a full conversation.  Exertion level should be approximately a 5/10,  if 10 is the most exertion you can perform.   Diet recommendations: Recommend a heart healthy diet such as the Mediterranean diet.  This diet consists of plant based foods, healthy fats, lean meats, olive oil.  It suggests limiting the intake of simple carbohydrates such as white breads, pastries, and pastas.  It also limits the amount of red meat, wine, and dairy  products such as cheese that one should consume on a daily basis.          Follow-up:  6 months.  Medication Adjustments/Labs and Tests Ordered: Current medicines are reviewed at length with the patient today.  Concerns regarding medicines are outlined above.   No orders of the defined types were placed in this encounter.  No orders of the defined types were placed in this encounter.  There are no Patient Instructions on file for this visit.    I,Mathew Stumpf,acting as a Education administrator for Joy Bergeron, MD.,have documented all relevant documentation on the behalf of Joy Bergeron, MD,as directed by  Joy Bergeron, MD while in the presence of Joy Bergeron, MD.  I, Joy Bergeron, MD, have reviewed all documentation for this visit. The documentation on 05/25/22 for the exam, diagnosis, procedures, and orders are all accurate and complete.   Signed, Joy Bergeron, MD  05/25/2022 2:37 PM    [place]

## 2022-05-26 DIAGNOSIS — M25511 Pain in right shoulder: Secondary | ICD-10-CM | POA: Insufficient documentation

## 2022-05-28 ENCOUNTER — Ambulatory Visit: Payer: Medicare Other | Attending: Cardiology | Admitting: Cardiology

## 2022-05-28 ENCOUNTER — Encounter: Payer: Self-pay | Admitting: Cardiology

## 2022-05-28 VITALS — BP 130/86 | HR 93 | Ht 62.0 in | Wt 173.0 lb

## 2022-05-28 DIAGNOSIS — E785 Hyperlipidemia, unspecified: Secondary | ICD-10-CM | POA: Diagnosis present

## 2022-05-28 DIAGNOSIS — I10 Essential (primary) hypertension: Secondary | ICD-10-CM | POA: Diagnosis present

## 2022-05-28 DIAGNOSIS — Z8249 Family history of ischemic heart disease and other diseases of the circulatory system: Secondary | ICD-10-CM | POA: Diagnosis present

## 2022-05-28 DIAGNOSIS — R072 Precordial pain: Secondary | ICD-10-CM | POA: Diagnosis present

## 2022-05-28 NOTE — Patient Instructions (Signed)
Medication Instructions:   Your physician recommends that you continue on your current medications as directed. Please refer to the Current Medication list given to you today.  *If you need a refill on your cardiac medications before your next appointment, please call your pharmacy*   Follow-Up: At  [facility], you and your health needs are our priority.  As part of our continuing mission to provide you with exceptional heart care, we have created designated Provider Care Teams.  These Care Teams include your primary Cardiologist (physician) and Advanced Practice Providers (APPs -  Physician Assistants and Nurse Practitioners) who all work together to provide you with the care you need, when you need it.  We recommend signing up for the patient portal called "MyChart".  Sign up information is provided on this After Visit Summary.  MyChart is used to connect with patients for Virtual Visits (Telemedicine).  Patients are able to view lab/test results, encounter notes, upcoming appointments, etc.  Non-urgent messages can be sent to your provider as well.   To learn more about what you can do with MyChart, go to [email or web address].    Your next appointment:   6 month(s)  The format for your next appointment:   In Person  Provider:   Dr. Pemberton   Important Information About Sugar       

## 2022-05-28 NOTE — Progress Notes (Signed)
Cardiology Office Note:    Date:  05/28/2022   ID:  Joy Cook, DOB Aug 02, 1966, MRN [id number]  PCP:  Joy Lass, MD   [facility] Providers Cardiologist:  None {  Referring MD: Joy Lass, MD   History of Present Illness:    Joy Cook is a 56 y.o. female with a hx of GERD, HTN and HLD who was previously followed by Dr. Meda Cook who now presents to clinic for follow-up.   Per review of the record, she underwent a stress test 10/2016 that was normal with no evidence of ischemia or infarct. Holter monitor at that time with infrequent PACs, few PVCs, no pausing. SB>>ST felt to be a normal monitor. Echo from 04/2020 with normal LVEF at 55-60% with no RWMA, G1DD, and no valvular disease. Coronary CTA 04/2020 showed no disease. Ca score 0.   Was last seen in clinic on 11/28/21 where she was doing very well from a CV standpoint.  Today, she is accompanied by a family member. The patient states that she had rotator cuff surgery last Monday, which reportedly went well. Lately she has been unable to sleep due to the pain and wearing a right arm cast 24/7. She will be wearing the cast for a few more weeks. Twice a week she presents for physical therapy. Her pain medication is causing constipation. She has been trying Miralax twice daily.  She complains of some left chest pain she believes to be musculoskeletal. She attributes this to needing to use her left arm for everything while wearing the right arm cast.  A couple months ago she had a mild dizzy spell that felt like vertigo. She woke up and the room was spinning. Since then there have been no recurring episodes.  She denies any palpitations, shortness of breath, or peripheral edema. No headaches, syncope, orthopnea, or PND.   Past Medical History:  Diagnosis Date   Arthritis    Complication of anesthesia    SLOW TO WAKE   Diverticulosis of colon    Family history of premature CAD    GAD (generalized anxiety disorder)     GERD (gastroesophageal reflux disease)    Hemorrhoids    History of diverticulitis of colon 2018   History of hyperthyroidism 2003   s/p RAI  08-05-2002   Hypertension    followed by pcp  and cardiology, dr Liane Comber---- (nuclear study 10-24-2016 in epic normal w/ nuclear ef 57%, CCTA 04-19-2020 in epic no obstruction calcium score zero   IDA (iron deficiency anemia)    MDD (major depressive disorder)    Moderate persistent asthma    followed by pcp   Postablative hypothyroidism    followed by pcp--- post RAI 08-05-2002   Schizoaffective disorder ([place])    w/ hx chronic auditory hallucinations   Sebaceous cyst    left upper back   Wears glasses     Past Surgical History:  Procedure Laterality Date   ABDOMINOPLASTY  3382   W/ UMBILICAL HERNIA REPAIR   BREAST LUMPECTOMY Right 1988   per pt benign   BREAST REDUCTION SURGERY Bilateral 1998   CESAREAN SECTION  x3  last one [place] C/S   COLONOSCOPY  last one 2013   CYST EXCISION Left 01/03/2021   Procedure: EXCISIONAL BIOPSY OF SEBACEOUS CYST ON LEFT UPPER BACK;  Surgeon: Leighton Ruff, MD;  Location: [facility];  Service: General;  Laterality: Left;   DILATATION & CURRETTAGE/HYSTEROSCOPY WITH  RESECTOCOPE N/A 05/03/2014   Procedure: [place];  Surgeon: Thurnell Lose, MD;  Location: [facility];  Service: Gynecology;  Laterality: N/A;   KNEE ARTHROSCOPY Bilateral early 1990s   REDUCTION MAMMAPLASTY     SHOULDER ARTHROSCOPY Left late 1990s    Current Medications: Current Meds  Medication Sig   acetaminophen (TYLENOL) 500 MG tablet Take 1,000 mg by mouth every 8 (eight) hours as needed.   Albuterol Sulfate (PROAIR RESPICLICK) 621 (90 Base) MCG/ACT AEPB Inhale 2 puffs into the lungs every 6 (six) hours as needed (shortness of breath/wheezing).   amLODipine (NORVASC) 5 MG tablet Take 5 mg by mouth daily.   ARIPiprazole (ABILIFY) 30 MG tablet  Take 30 mg by mouth daily.   atorvastatin (LIPITOR) 20 MG tablet TAKE 1 TABLET BY MOUTH EVERY DAY   cetirizine (ZYRTEC) 10 MG tablet Take 10 mg by mouth daily.   Cholecalciferol (VITAMIN D) 50 MCG (2000 UT) tablet Take 2,000 Units by mouth daily.    clonazePAM (KLONOPIN) 0.5 MG tablet Take 0.5 mg by mouth at bedtime as needed (sleep).    Ferrous Sulfate 27 MG TABS Take 27 mg by mouth daily.    fluticasone (FLONASE) 50 MCG/ACT nasal spray Place 1 spray into both nostrils daily at 12 noon.    fluticasone (FLOVENT HFA) 110 MCG/ACT inhaler Inhale 1 puff into the lungs 2 (two) times daily.    hydrochlorothiazide (HYDRODIURIL) 25 MG tablet Take 25 mg by mouth daily.   levalbuterol (XOPENEX) 1.25 MG/3ML nebulizer solution Take 1.25 mg by nebulization every 4 (four) hours as needed for wheezing.   magnesium oxide (MAG-OX) 400 MG tablet Take 400 mg by mouth 2 (two) times daily.   methocarbamol (ROBAXIN) 750 MG tablet Take 750 mg by mouth every 8 (eight) hours as needed.   naloxone (NARCAN) nasal spray 4 mg/0.1 mL Narcan 4 mg/actuation nasal spray  TAKE BY NASAL ROUTE EVERY 3 MINUTES UNTIL PATIENT AWAKES OR EMS ARRIVES.   OVER THE COUNTER MEDICATION Apply 1 application topically See admin instructions. CRAMER ANALGESIC CREAM - apply topically 5 times daily prn pain   pantoprazole (PROTONIX) 40 MG tablet Take 40 mg by mouth at bedtime.   perphenazine (TRILAFON) 8 MG tablet Take 4-8 mg by mouth See admin instructions. Take one tablet (8 mg) by mouth twice daily, take 1/2 tablet (4 mg) at bedtime   SYNTHROID 88 MCG tablet Take 88 mcg by mouth daily.   traMADol (ULTRAM) 50 MG tablet Take 50 mg by mouth 2 (two) times daily as needed (pain).    triamcinolone cream (KENALOG) 0.1 % Apply 1 application topically 2 (two) times daily as needed (itching).   WIXELA INHUB 100-50 MCG/ACT AEPB Inhale 1 puff into the lungs 2 (two) times daily.     Allergies:   Shellfish allergy, Codeine, Hydrocodone, and Tandem plus  [[id number]]   Social History   Socioeconomic History   Marital status: Married    Spouse name: Not on file   Number of children: Not on file   Years of education: Not on file   Highest education level: Not on file  Occupational History   Not on file  Tobacco Use   Smoking status: Never   Smokeless tobacco: Never  Vaping Use   Vaping Use: Never used  Substance and Sexual Activity   Alcohol use: No   Drug use: Never   Sexual activity: Yes  Other Topics Concern   Not on file  Social History Narrative   **  Merged History Encounter **       Social Determinants of Health   Financial Resource Strain: Not on file  Food Insecurity: Not on file  Transportation Needs: Not on file  Physical Activity: Not on file  Stress: Not on file  Social Connections: Not on file     Family History: The patient's family history includes Hypertension in an other family member.  ROS:   Review of Systems  Constitutional:  Negative for chills.  HENT:  Negative for sinus pain and tinnitus.   Eyes:  Negative for double vision.  Respiratory:  Negative for cough and shortness of breath.   Cardiovascular:  Positive for chest pain (Left, musculoskeletal). Negative for palpitations, orthopnea, claudication, leg swelling and PND.  Gastrointestinal:  Positive for constipation. Negative for diarrhea and heartburn.  Genitourinary:  Negative for flank pain.  Musculoskeletal:  Positive for myalgias (Right UE). Negative for back pain.  Neurological:  Positive for dizziness (Isolated episode). Negative for tremors and loss of consciousness.  Endo/Heme/Allergies:  Does not bruise/bleed easily.  Psychiatric/Behavioral:  Negative for depression and suicidal ideas.      EKGs/Labs/Other Studies Reviewed:    The following studies were reviewed today:  Echo 04/26/2020:  1. Left ventricular ejection fraction, by estimation, is 55 to 60%. The  left ventricle has normal function. The left  ventricle has no regional  wall motion abnormalities. Left ventricular diastolic parameters are  consistent with Grade I diastolic  dysfunction (impaired relaxation).   2. Right ventricular systolic function is normal. The right ventricular  size is normal. Tricuspid regurgitation signal is inadequate for assessing  PA pressure.   3. The mitral valve is normal in structure. No evidence of mitral valve  regurgitation.   4. The aortic valve is tricuspid. Aortic valve regurgitation is not  visualized. No aortic stenosis is present.   5. The inferior vena cava is normal in size with greater than 50%  respiratory variability, suggesting right atrial pressure of 3 mmHg.   Coronary CTA 04/2020: FINDINGS: Image quality: Excellent.   Noise artifact is: Limited.   Coronary Arteries:  Normal coronary origin.  Right dominance.   Left main: The left main is a large caliber vessel with a normal take off from the left coronary cusp that bifurcates to form a left anterior descending artery and a left circumflex artery. There is no plaque or stenosis.   Left anterior descending artery: The LAD is patent without evidence of plaque or stenosis. The LAD gives off 2 patent diagonal branches.   Left circumflex artery: The LCX is non-dominant and patent with no evidence of plaque or stenosis. The LCX gives off 2 patent obtuse marginal branches.   Right coronary artery: The RCA is dominant with normal take off from the right coronary cusp. There is no evidence of plaque or stenosis. The RCA terminates as a PDA and right posterolateral branch without evidence of plaque or stenosis.   Right Atrium: Right atrial size is within normal limits.   Right Ventricle: The right ventricular cavity is within normal limits.   Left Atrium: Left atrial size is normal in size with no left atrial appendage filling defect.   Left Ventricle: The ventricular cavity size is within normal limits. There are no  stigmata of prior infarction. There is no abnormal filling defect.   Pulmonary arteries: Normal in size without proximal filling defect.   Pulmonary veins: Normal pulmonary venous drainage.   Pericardium: Normal thickness with no significant effusion or calcium present.  Cardiac valves: The aortic valve is trileaflet without significant calcification. The mitral valve is normal structure without significant calcification.   Aorta: Normal caliber with no significant disease.   Extra-cardiac findings: See attached radiology report for non-cardiac structures.   IMPRESSION: 1. Coronary calcium score of 0.   2. Normal coronary origin with right dominance.   3. Normal coronary arteries.   RECOMMENDATIONS: 1. No evidence of CAD (0%). Consider non-atherosclerotic causes of chest pain.  Stress test 10/2016:   Nuclear stress EF: 57%. The left ventricular ejection fraction is normal (55-65%). There was no ST segment deviation noted during stress. The study is normal.   1. Normal LV systolic function.  2. No evidence for ischemia or infarction, normal study.    Holter 10/2016:   Infrequent PACs, few PVCs, no runs. No pauses > 2 seconds. Sinus bradycardia to sinus tachycardia.   Normal Holter monitor.    EKG:  EKG is personally reviewed. 05/28/2022:  EKG was not ordered. 11/28/2021: Sinus rhythm. Rate 70 bpm.  Recent Labs: No results found for requested labs within last 365 days.   Recent Lipid Panel    Component Value Date/Time   CHOL 129 11/28/2021 1057   TRIG 59 11/28/2021 1057   HDL 53 11/28/2021 1057   CHOLHDL 2.4 11/28/2021 1057   LDLCALC 63 11/28/2021 1057     Risk Assessment/Calculations:           Physical Exam:    VS:  BP 130/86   Pulse 93   Ht '5\' 2"'$  (1.575 m)   Wt 173 lb (78.5 kg)   LMP  (LMP Unknown) Comment: tubes tied  SpO2 97%   BMI 31.64 kg/m     Wt Readings from Last 3 Encounters:  05/28/22 173 lb (78.5 kg)  02/20/22 172 lb (78  kg)  11/28/21 169 lb 9.6 oz (76.9 kg)     GEN: Well nourished, well developed in no acute distress HEENT: Normal NECK: No JVD; No carotid bruits CARDIAC: RRR, no murmurs, rubs, gallops RESPIRATORY:  Clear to auscultation without rales, wheezing or rhonchi  ABDOMEN: Soft, non-tender, non-distended MUSCULOSKELETAL:  Right arm sling in place, no LE edema SKIN: Warm and dry NEUROLOGIC:  Alert and oriented x 3 PSYCHIATRIC:  Normal affect   ASSESSMENT:    1. Essential hypertension   2. Family history of early CAD   3. Precordial pain   4. Hyperlipidemia, unspecified hyperlipidemia type     PLAN:    In order of problems listed above:  #Chest Pain: Coronary CTA negative for obstructive disease. TTE with normal BiV function, G1DD. Doing well.   #HLD: -Continue lipitor '20mg'$  daily -LDL well controlled at 63 (11/2021) -Goal LDL<100; Ca score 0   #HTN: Well controlled and at goal <130/90. -Continue amlodipine '5mg'$  daily -Continue HCTZ '25mg'$  daily   #DMII: A1C 6.6 at PCP office. Working of lifestyle modifications.   Exercise recommendations: Goal of exercising for at least 30 minutes a day, at least 5 times per week.  Please exercise to a moderate exertion.  This means that while exercising it is difficult to speak in full sentences, however you are not so short of breath that you feel you must stop, and not so comfortable that you can carry on a full conversation.  Exertion level should be approximately a 5/10, if 10 is the most exertion you can perform.   Diet recommendations: Recommend a heart healthy diet such as the Mediterranean diet.  This diet consists of plant based foods,  healthy fats, lean meats, olive oil.  It suggests limiting the intake of simple carbohydrates such as white breads, pastries, and pastas.  It also limits the amount of red meat, wine, and dairy products such as cheese that one should consume on a daily basis.          Follow-up:  6 months.  Medication  Adjustments/Labs and Tests Ordered: Current medicines are reviewed at length with the patient today.  Concerns regarding medicines are outlined above.   No orders of the defined types were placed in this encounter.  No orders of the defined types were placed in this encounter.  Patient Instructions  Medication Instructions:   Your physician recommends that you continue on your current medications as directed. Please refer to the Current Medication list given to you today.  *If you need a refill on your cardiac medications before your next appointment, please call your pharmacy*   Follow-Up: At [facility], you and your health needs are our priority.  As part of our continuing mission to provide you with exceptional heart care, we have created designated Provider Care Teams.  These Care Teams include your primary Cardiologist (physician) and Advanced Practice Providers (APPs -  Physician Assistants and Nurse Practitioners) who all work together to provide you with the care you need, when you need it.  We recommend signing up for the patient portal called "MyChart".  Sign up information is provided on this After Visit Summary.  MyChart is used to connect with patients for Virtual Visits (Telemedicine).  Patients are able to view lab/test results, encounter notes, upcoming appointments, etc.  Non-urgent messages can be sent to your provider as well.   To learn more about what you can do with MyChart, go to [email or web address].    Your next appointment:   6 month(s)  The format for your next appointment:   In Person  Provider:   Dr. Johney Frame   Important Information About Sugar         I,Mathew Stumpf,acting as a scribe for Freada Bergeron, MD.,have documented all relevant documentation on the behalf of Freada Bergeron, MD,as directed by  Freada Bergeron, MD while in the presence of Freada Bergeron, MD.  I, Freada Bergeron, MD, have reviewed all  documentation for this visit. The documentation on 05/28/22 for the exam, diagnosis, procedures, and orders are all accurate and complete.   Signed, Freada Bergeron, MD  05/28/2022 11:47 AM    [place]

## 2022-08-28 ENCOUNTER — Other Ambulatory Visit: Payer: Self-pay | Admitting: Obstetrics and Gynecology

## 2022-08-28 DIAGNOSIS — E2839 Other primary ovarian failure: Secondary | ICD-10-CM

## 2022-09-29 DIAGNOSIS — Z9889 Other specified postprocedural states: Secondary | ICD-10-CM | POA: Insufficient documentation

## 2022-10-09 DIAGNOSIS — M25611 Stiffness of right shoulder, not elsewhere classified: Secondary | ICD-10-CM | POA: Insufficient documentation

## 2022-11-25 DIAGNOSIS — K5903 Drug induced constipation: Secondary | ICD-10-CM | POA: Insufficient documentation

## 2022-12-23 ENCOUNTER — Other Ambulatory Visit: Payer: Self-pay

## 2022-12-23 MED ORDER — ATORVASTATIN CALCIUM 20 MG PO TABS: 20.0000 mg | ORAL_TABLET | Freq: Every day | ORAL | 1 refills | Status: DC

## 2023-03-03 ENCOUNTER — Other Ambulatory Visit: Payer: Self-pay | Admitting: Family Medicine

## 2023-03-03 DIAGNOSIS — Z1231 Encounter for screening mammogram for malignant neoplasm of breast: Secondary | ICD-10-CM

## 2023-03-10 ENCOUNTER — Other Ambulatory Visit: Payer: Self-pay | Admitting: Pain Medicine

## 2023-03-10 ENCOUNTER — Ambulatory Visit: Admission: RE | Admit: 2023-03-10 | Payer: Medicare Other | Source: Ambulatory Visit

## 2023-03-10 DIAGNOSIS — J069 Acute upper respiratory infection, unspecified: Secondary | ICD-10-CM

## 2023-04-07 ENCOUNTER — Ambulatory Visit: Admission: RE | Admit: 2023-04-07 | Payer: Medicare Other | Source: Ambulatory Visit

## 2023-04-07 DIAGNOSIS — Z1231 Encounter for screening mammogram for malignant neoplasm of breast: Secondary | ICD-10-CM

## 2023-04-29 ENCOUNTER — Ambulatory Visit: Payer: Medicare Other | Attending: Cardiology | Admitting: Cardiology

## 2023-04-29 ENCOUNTER — Encounter: Payer: Self-pay | Admitting: Cardiology

## 2023-04-29 VITALS — BP 132/76 | HR 82 | Ht 62.0 in | Wt 176.4 lb

## 2023-04-29 DIAGNOSIS — E785 Hyperlipidemia, unspecified: Secondary | ICD-10-CM | POA: Diagnosis present

## 2023-04-29 DIAGNOSIS — R079 Chest pain, unspecified: Secondary | ICD-10-CM | POA: Diagnosis present

## 2023-04-29 DIAGNOSIS — I10 Essential (primary) hypertension: Secondary | ICD-10-CM | POA: Diagnosis present

## 2023-04-29 NOTE — Patient Instructions (Signed)

## 2023-04-29 NOTE — Progress Notes (Signed)
Cardiology Office Note:  .   Date:  04/29/2023  ID:  Joy Cook, DOB [birth year], MRN [id number] PCP: Sigmund Hazel, MD  [facility] Providers Cardiologist:  Donato Schultz, MD    History of Present Illness: .   Joy Cook is a 57 y.o. female with a hx of GERD, HTN and HLD who was previously followed by Dr. Delton See who now presents to clinic for follow-up.    Per review of the record, she underwent a stress test 10/2016 that was normal with no evidence of ischemia or infarct. Holter monitor at that time with infrequent PACs, few PVCs, no pausing. SB>>ST felt to be a normal monitor. Echo from 04/2020 with normal LVEF at 55-60% with no RWMA, G1DD, and no valvular disease. Coronary CTA 04/2020 showed no disease. Ca score 0.   Discussed the use of AI scribe software  History of Present Illness   The patient, with a history of diabetes, hypertension, and hyperlipidemia, presents with discomfort under her left breast. The discomfort is in the same location as a growth she had as a child, which resolved spontaneously. The patient's diabetes is currently diet-controlled, and she is not taking any medication for it. Her hypertension and hyperlipidemia are well-managed with hydrochlorothiazide 25mg , amlodipine 5mg , and atorvastatin 20mg  respectively. The patient's LDL was most recently 69, which is below the target of 70. The patient has had extensive cardiac testing in the past, including a coronary CT scan in 2021, a stress test in 2018, and a Holter monitor in 2018. All tests have shown no evidence of coronary artery disease or ischemia. The patient's creatinine is 0.76 and hemoglobin is 12.6, both within normal limits.         Studies Reviewed: Marland Kitchen   EKG Interpretation Date/Time:  Wednesday April 29 2023 10:17:01 EDT Ventricular Rate:  76 PR Interval:  128 QRS Duration:  72 QT Interval:  336 QTC Calculation: 378 R Axis:   -2  Text Interpretation: Normal sinus rhythm ST & T wave  abnormality, consider inferior ischemia When compared with ECG of 20-Nov-2019 15:31, QRS axis Shifted right T wave inversion now evident in Inferior leads Nonspecific T wave abnormality, worse in Anterolateral leads Confirmed by Donato Schultz ([id number]) on 04/29/2023 10:36:57 AM    EKG Interpretation Date/Time:  Wednesday April 29 2023 10:17:01 EDT Ventricular Rate:  76 PR Interval:  128 QRS Duration:  72 QT Interval:  336 QTC Calculation: 378 R Axis:   -2  Text Interpretation: Normal sinus rhythm ST & T wave abnormality, consider inferior ischemia When compared with ECG of 20-Nov-2019 15:31, QRS axis Shifted right T wave inversion now evident in Inferior leads Nonspecific T wave abnormality, worse in Anterolateral leads Confirmed by Donato Schultz ([id number]) on 04/29/2023 10:36:57 AM   Risk Assessment/Calculations:            Physical Exam:   VS:  BP 132/76   Pulse 82   Ht 5\' 2"  (1.575 m)   Wt 176 lb 6.4 oz (80 kg)   LMP  (LMP Unknown) Comment: tubes tied  SpO2 98%   BMI 32.26 kg/m    Wt Readings from Last 3 Encounters:  04/29/23 176 lb 6.4 oz (80 kg)  05/28/22 173 lb (78.5 kg)  02/20/22 172 lb (78 kg)    GEN: Well nourished, well developed in no acute distress NECK: No JVD; No carotid bruits CARDIAC: RRR, no murmurs, rubs, gallops RESPIRATORY:  Clear to auscultation without rales, wheezing or rhonchi  ABDOMEN:  Soft, non-tender, non-distended EXTREMITIES:  No edema; No deformity   ASSESSMENT AND PLAN: .    Assessment and Plan    Chest Discomfort Left-sided chest discomfort in the same location as a childhood skin lesion. No cardiac etiology based on prior normal stress test, coronary CT, and echocardiogram. Possible residual discomfort from old skin lesion or potential nerve involvement. -No changes to current management. -Has obtained shingles vaccine.  Hypertension Well controlled on Hydrochlorothiazide 25mg  daily and Amlodipine 5mg  daily. -Continue current  medications.  Hyperlipidemia Well controlled with Atorvastatin 20mg  daily, recent LDL 69. -Continue Atorvastatin 20mg  daily.  Diabetes Mellitus Diet-controlled, no current medications. Discussed potential future use of Ozempic.  She is worried about using this lifelong. -Continue diet modification and regular monitoring of A1c.  Last hemoglobin A1c 6.8.  Expressed the importance of good blood sugar control for reduction in heart attacks and strokes.  General Health Maintenance -Continue annual follow-up appointments.              Signed, Donato Schultz, MD

## 2023-07-22 ENCOUNTER — Other Ambulatory Visit: Payer: Self-pay

## 2023-07-22 MED ORDER — ATORVASTATIN CALCIUM 20 MG PO TABS: 20.0000 mg | ORAL_TABLET | Freq: Every day | ORAL | 2 refills | Status: DC

## 2023-07-28 ENCOUNTER — Ambulatory Visit
Admission: RE | Admit: 2023-07-28 | Discharge: 2023-07-28 | Disposition: A | Discharge status: Home / Self Care | Payer: Medicare Other | Source: Ambulatory Visit | Attending: Obstetrics and Gynecology | Admitting: Obstetrics and Gynecology

## 2023-07-28 DIAGNOSIS — E2839 Other primary ovarian failure: Secondary | ICD-10-CM

## 2023-10-28 ENCOUNTER — Other Ambulatory Visit: Payer: Self-pay

## 2023-10-28 DIAGNOSIS — T7840XA Allergy, unspecified, initial encounter: Secondary | ICD-10-CM

## 2023-11-04 DIAGNOSIS — R1313 Dysphagia, pharyngeal phase: Secondary | ICD-10-CM | POA: Insufficient documentation

## 2023-11-04 DIAGNOSIS — G479 Sleep disorder, unspecified: Secondary | ICD-10-CM | POA: Insufficient documentation

## 2023-11-05 ENCOUNTER — Telehealth: Payer: Self-pay

## 2023-11-05 NOTE — Telephone Encounter (Signed)
 Received referral from Dr Christia Reading MD for Snoring and primary insomnia, r/o OSA. Placed in sleep mailbox

## 2023-12-14 DIAGNOSIS — Z1211 Encounter for screening for malignant neoplasm of colon: Secondary | ICD-10-CM | POA: Insufficient documentation

## 2023-12-14 DIAGNOSIS — K580 Irritable bowel syndrome with diarrhea: Secondary | ICD-10-CM | POA: Insufficient documentation

## 2023-12-14 DIAGNOSIS — R143 Flatulence: Secondary | ICD-10-CM | POA: Insufficient documentation

## 2023-12-14 DIAGNOSIS — R1032 Left lower quadrant pain: Secondary | ICD-10-CM | POA: Insufficient documentation

## 2023-12-14 DIAGNOSIS — Z800 Family history of malignant neoplasm of digestive organs: Secondary | ICD-10-CM | POA: Insufficient documentation

## 2023-12-14 DIAGNOSIS — E669 Obesity, unspecified: Secondary | ICD-10-CM | POA: Insufficient documentation

## 2023-12-14 DIAGNOSIS — R635 Abnormal weight gain: Secondary | ICD-10-CM | POA: Insufficient documentation

## 2023-12-14 DIAGNOSIS — K219 Gastro-esophageal reflux disease without esophagitis: Secondary | ICD-10-CM | POA: Insufficient documentation

## 2023-12-14 DIAGNOSIS — K5730 Diverticulosis of large intestine without perforation or abscess without bleeding: Secondary | ICD-10-CM | POA: Insufficient documentation

## 2023-12-14 DIAGNOSIS — R140 Abdominal distension (gaseous): Secondary | ICD-10-CM | POA: Insufficient documentation

## 2023-12-14 DIAGNOSIS — R1013 Epigastric pain: Secondary | ICD-10-CM | POA: Insufficient documentation

## 2023-12-15 ENCOUNTER — Encounter: Payer: Self-pay | Admitting: Neurology

## 2023-12-15 ENCOUNTER — Ambulatory Visit (INDEPENDENT_AMBULATORY_CARE_PROVIDER_SITE_OTHER): Admitting: Neurology

## 2023-12-15 VITALS — BP 142/74 | HR 89 | Ht 62.0 in | Wt 179.8 lb

## 2023-12-15 DIAGNOSIS — G4700 Insomnia, unspecified: Secondary | ICD-10-CM

## 2023-12-15 DIAGNOSIS — G4719 Other hypersomnia: Secondary | ICD-10-CM

## 2023-12-15 DIAGNOSIS — R351 Nocturia: Secondary | ICD-10-CM

## 2023-12-15 DIAGNOSIS — E66811 Obesity, class 1: Secondary | ICD-10-CM

## 2023-12-15 DIAGNOSIS — Z9189 Other specified personal risk factors, not elsewhere classified: Secondary | ICD-10-CM

## 2023-12-15 DIAGNOSIS — R0683 Snoring: Secondary | ICD-10-CM | POA: Diagnosis not present

## 2023-12-15 DIAGNOSIS — R058 Other specified cough: Secondary | ICD-10-CM

## 2023-12-15 NOTE — Patient Instructions (Signed)

## 2023-12-15 NOTE — Progress Notes (Signed)
 Subjective:    Patient ID: Joy Cook is a 58 y.o. female.  HPI    Joy Fairy, MD, PhD [facility] [address] [address] [place], [place] [address]  Dear Dr. Tellis Feathers,   I saw your patient, Joy Cook, upon your kind request in my sleep clinic today for initial consultation of her sleep disorder, and fascicular, concern for underlying obstructive sleep apnea.  The patient is accompanied by her husband today.  As you know, Joy Cook is a 58 year old female with an underlying complex medical history of arthritis, diverticulosis, reflux disease, anxiety, hyperthyroidism with status post radioactive iodine and subsequent hypothyroidism, hypertension, depression, mood disorder, asthma, allergies, and obesity, who reports snoring and excessive daytime somnolence as well as waking up with a sense of gasping for air and coughing at night.  Her Epworth sleepiness score is 7 out of 24, fatigue severity score is 50 out of 63.  I reviewed your office note from 11/04/2023.  She had further evaluation with a flexible laryngoscope.  She was noted to have a normal pharynx and larynx.   She has trouble falling asleep.  She takes clonazepam at night for sleep, she is followed by psychiatry and is on Abilify  daily as well as perphenazine.  She is followed by pain management and takes oxycodone  as needed.  Bedtime is generally around 10 or 11 but she may not be asleep until 2 or 3 AM.  Rise time is around 8 AM.  She has nocturia once per average night, typically in the early morning hours.  She has had weight gain over time.  She lives with her husband, no pets in the household.  She sleeps with a fan on and the TV on, on a sleep app or program.  She cannot sleep in the dark.  Her kids are grown.   Her Past Medical History Is Significant For: Past Medical History:  Diagnosis Date   Arthritis    Complication of anesthesia    SLOW TO WAKE   Diverticulosis of colon     Family history of premature CAD    GAD (generalized anxiety disorder)    GERD (gastroesophageal reflux disease)    Hemorrhoids    History of diverticulitis of colon 2018   History of hyperthyroidism 2003   s/p RAI  08-05-2002   Hypertension    followed by pcp  and cardiology, dr Dawson Europe---- (nuclear study 10-24-2016 in epic normal w/ nuclear ef 57%, CCTA 04-19-2020 in epic no obstruction calcium  score zero   IDA (iron deficiency anemia)    MDD (major depressive disorder)    Moderate persistent asthma    followed by pcp   Postablative hypothyroidism    followed by pcp--- post RAI 08-05-2002   Schizoaffective disorder (HCC)    w/ hx chronic auditory hallucinations   Sebaceous cyst    left upper back   Wears glasses     Her Past Surgical History Is Significant For: Past Surgical History:  Procedure Laterality Date   ABDOMINOPLASTY  2010   W/ UMBILICAL HERNIA REPAIR   BREAST LUMPECTOMY Right 1988   per pt benign   BREAST REDUCTION SURGERY Bilateral 1998   CESAREAN SECTION  x3  last one 1988   BILATERAL TUBAL LIGATION WITH LAST C/S   COLONOSCOPY  last one 2013   CYST EXCISION Left 01/03/2021   Procedure: EXCISIONAL BIOPSY OF SEBACEOUS CYST ON LEFT UPPER BACK;  Surgeon: Joyce Nixon, MD;  Location: [facility];  Service: General;  Laterality: Left;   DILATATION & CURRETTAGE/HYSTEROSCOPY WITH RESECTOCOPE N/A 05/03/2014   Procedure: DILATATION & CURETTAGE/HYSTEROSCOPY WITH RESECTOCOPE;  Surgeon: Johnn Najjar, MD;  Location: [facility];  Service: Gynecology;  Laterality: N/A;   KNEE ARTHROSCOPY Bilateral early 1990s   REDUCTION MAMMAPLASTY     SHOULDER ARTHROSCOPY Left late 1990s   SHOULDER ARTHROSCOPY Right    2023 [facility]    Her Family History Is Significant For: Family History  Problem Relation Age of Onset   Hypertension Mother    Congenital heart disease Brother    Kidney disease Brother    Heart attack Maternal Grandmother    Heart attack Maternal  Grandfather     Her Social History Is Significant For: Social History   Socioeconomic History   Marital status: Married    Spouse name: Janit Meline   Number of children: 4   Years of education: Not on file   Highest education level: Not on file  Occupational History   Not on file  Tobacco Use   Smoking status: Never   Smokeless tobacco: Never  Vaping Use   Vaping status: Never Used  Substance and Sexual Activity   Alcohol use: Never   Drug use: Never   Sexual activity: Yes  Other Topics Concern   Not on file  Social History Narrative   ** Merged History Encounter ** 4 Kids (blended family)   Caffiene 1/2 cup coffee daily   Live Lives with husband.   Retired from [employer].    Social Drivers of Corporate investment banker Strain: Not on [employer] Insecurity: Not on file  Transportation Needs: Not on file  Physical Activity: Not on file  Stress: Not on file  Social Connections: Not on file    Her Allergies Are:  Allergies  Allergen Reactions   Shellfish Allergy Anaphylaxis, Hives and Itching   Codeine Hives and Nausea Only    Fatigue, dizziness, and nausea     Hydrocodone Nausea Only    Dizziness    Tandem Plus [Fefum-Fepo-Fa-B Cmp-C-Zn-Mn-Cu] Nausea And Vomiting and Other (See Comments)    Dizzy and fatigue   :   Her Current Medications Are:  Outpatient Encounter Medications as of 12/15/2023  Medication Sig   acetaminophen  (TYLENOL ) 500 MG tablet Take 1,000 mg by mouth every 8 (eight) hours as needed.   amLODipine  (NORVASC ) 5 MG tablet Take 5 mg by mouth daily.   ARIPiprazole  (ABILIFY ) 30 MG tablet Take 30 mg by mouth daily.   atorvastatin  (LIPITOR) 20 MG tablet Take 1 tablet (20 mg total) by mouth daily.   cetirizine (ZYRTEC) 10 MG tablet Take 10 mg by mouth daily.   Cholecalciferol  (VITAMIN D) 50 MCG (2000 UT) tablet Take 2,000 Units by mouth daily.    clonazePAM (KLONOPIN) 0.5 MG tablet Take 0.5 mg by mouth at bedtime as needed (sleep).    Ferrous Sulfate  27 MG  TABS Take 27 mg by mouth daily.    fluticasone  (FLONASE ) 50 MCG/ACT nasal spray Place 1 spray into both nostrils daily at 12 noon.    fluticasone  (FLOVENT  HFA) 110 MCG/ACT inhaler Inhale 1 puff into the lungs 2 (two) times daily.    hydrochlorothiazide  (HYDRODIURIL ) 25 MG tablet Take 25 mg by mouth daily.   levalbuterol (XOPENEX) 1.25 MG/3ML nebulizer solution Take 1.25 mg by nebulization every 4 (four) hours as needed for wheezing.   naloxone (NARCAN) nasal spray 4 mg/0.1 mL Narcan 4 mg/actuation nasal spray  TAKE  BY NASAL ROUTE EVERY 3 MINUTES UNTIL PATIENT AWAKES OR EMS ARRIVES.   OVER THE COUNTER MEDICATION Apply 1 application topically See admin instructions. CRAMER ANALGESIC CREAM - apply topically 5 times daily prn pain   oxyCODONE  (OXY IR/ROXICODONE ) 5 MG immediate release tablet Take 5 mg by mouth as needed.   perphenazine (TRILAFON) 8 MG tablet Take 4-8 mg by mouth See admin instructions. Take one tablet (8 mg) by mouth twice daily, take 1/2 tablet (4 mg) at bedtime   SYNTHROID  88 MCG tablet Take 88 mcg by mouth daily.   triamcinolone cream (KENALOG) 0.1 % Apply 1 application topically 2 (two) times daily as needed (itching).   WIXELA INHUB 100-50 MCG/ACT AEPB Inhale 1 puff into the lungs 2 (two) times daily.   [DISCONTINUED] Albuterol  Sulfate (PROAIR  RESPICLICK) 108 (90 Base) MCG/ACT AEPB Inhale 2 puffs into the lungs every 6 (six) hours as needed (shortness of breath/wheezing). (Patient not taking: Reported on 04/29/2023)   [DISCONTINUED] magnesium  oxide (MAG-OX) 400 MG tablet Take 400 mg by mouth 2 (two) times daily.   [DISCONTINUED] methocarbamol (ROBAXIN) 750 MG tablet Take 750 mg by mouth every 8 (eight) hours as needed.   [DISCONTINUED] pantoprazole  (PROTONIX ) 40 MG tablet Take 40 mg by mouth at bedtime. (Patient not taking: Reported on 04/29/2023)   [DISCONTINUED] traMADol  (ULTRAM ) 50 MG tablet Take 50 mg by mouth 2 (two) times daily as needed (pain).  (Patient not taking: Reported  on 04/29/2023)   No facility-administered encounter medications on file as of 12/15/2023.  :   Review of Systems:  Out of a complete 14 point review of systems, all are reviewed and negative with the exception of these symptoms as listed below:   Review of Systems  Neurological:        Snoring, waking up choking, daytime fatigue. Vivid dreams.  ESS 7 FSS  50.  So sleep study.    Objective:  Neurological Exam  Physical Exam Physical Examination:   Vitals:   12/15/23 1413  BP: (!) 142/74  Pulse: 89  SpO2: 98%    General Examination: The patient is a very pleasant 58 y.o. female in no acute distress. She appears well-developed and well-nourished and well groomed.   HEENT: Normocephalic, atraumatic, pupils are equal, round and reactive to light, extraocular tracking is good without limitation to gaze excursion or nystagmus noted. Hearing is grossly intact. Face is symmetric with normal facial animation. Speech is clear with no dysarthria noted. There is no hypophonia. There is no lip, neck/head, jaw or voice tremor. Neck is supple with full range of passive and active motion. There are no carotid bruits on auscultation. Oropharynx exam reveals: mild mouth dryness, good dental hygiene and moderate airway crowding, due to small airway entry, Mallampati class II, tonsils on the smaller side, maybe 1-2+ bilaterally.  Slight crossbite noted.  Tongue protrudes centrally and palate elevates symmetrically, thicker tongue noted.  Neck circumference 15-1/2 inches.  Chest: Clear to auscultation without wheezing, rhonchi or crackles noted.  Heart: S1+S2+0, regular and normal without murmurs, rubs or gallops noted.   Abdomen: Soft, non-tender and non-distended.  Extremities: There is no pitting edema in the distal lower extremities bilaterally.   Skin: Warm and dry without trophic changes noted.   Musculoskeletal: exam reveals no obvious joint deformities.   Neurologically:  Mental status:  The patient is awake, alert and oriented in all 4 spheres. Her immediate and remote memory, attention, language skills and fund of knowledge are appropriate. There is no evidence  of aphasia, agnosia, apraxia or anomia. Speech is clear with normal prosody and enunciation. Thought process is linear. Mood is normal and affect is normal.  Cranial nerves II - XII are as described above under HEENT exam.  Motor exam: Normal bulk, strength and tone is noted. There is no obvious action or resting tremor.  Fine motor skills and coordination: grossly intact.  Cerebellar testing: No dysmetria or intention tremor. There is no truncal or gait ataxia.  Sensory exam: intact to light touch in the upper and lower extremities.  Gait, station and balance: She stands easily. No veering to one side is noted. No leaning to one side is noted. Posture is age-appropriate and stance is narrow based. Gait shows normal stride length and normal pace. No problems turning are noted.   Assessment and Plan:  In summary, Joy Cook is a very pleasant 58 year old female with an underlying complex medical history of arthritis, diverticulosis, reflux disease, anxiety, hyperthyroidism with status post radioactive iodine and subsequent hypothyroidism, hypertension, depression, mood disorder, asthma, allergies, and obesity, whose history and physical exam are concerning for sleep disordered breathing, particularly obstructive sleep apnea (OSA). A laboratory attended sleep study is typically considered "gold standard" for evaluation of sleep disordered breathing.   I had a long chat with the patient and her husband about my findings and the diagnosis of sleep apnea, particularly OSA, its prognosis and treatment options. We talked about medical/conservative treatments, surgical interventions and non-pharmacological approaches for symptom control. I explained, in particular, the risks and ramifications of untreated moderate to severe OSA,  especially with respect to developing cardiovascular disease down the road, including congestive heart failure (CHF), difficult to treat hypertension, cardiac arrhythmias (particularly A-fib), neurovascular complications including TIA, stroke and dementia. Even type 2 diabetes has, in part, been linked to untreated OSA. Symptoms of untreated OSA may include (but may not be limited to) daytime sleepiness, nocturia (i.e. frequent nighttime urination), memory problems, mood irritability and suboptimally controlled or worsening mood disorder such as depression and/or anxiety, lack of energy, lack of motivation, physical discomfort, as well as recurrent headaches, especially morning or nocturnal headaches. We talked about the importance of maintaining a healthy lifestyle and striving for healthy weight. In addition, we talked about the importance of striving for and maintaining good sleep hygiene. I recommended a sleep study at this time. I outlined the differences between a laboratory attended sleep study which is considered more comprehensive and accurate over the option of a home sleep test (HST); the latter may lead to underestimation of sleep disordered breathing in some instances and does not help with diagnosing upper airway resistance syndrome and is not accurate enough to diagnose primary central sleep apnea typically. I outlined possible surgical and non-surgical treatment options of OSA, including the use of a positive airway pressure (PAP) device (i.e. CPAP, AutoPAP/APAP or BiPAP in certain circumstances), a custom-made dental device (aka oral appliance, which would require a referral to a specialist dentist or orthodontist typically, and is generally speaking not considered for patients with full dentures or edentulous state), upper airway surgical options, such as traditional UPPP (which is not considered a first-line treatment) or the Inspire device (hypoglossal nerve stimulator, which would involve a  referral for consultation with an ENT surgeon, after careful selection, following inclusion criteria - also not first-line treatment). I explained the PAP treatment option to the patient in detail, as this is generally considered first-line treatment.  The patient indicated that she would be willing to try PAP  therapy, if the need arises. I explained the importance of being compliant with PAP treatment, not only for insurance purposes but primarily to improve patient's symptoms symptoms, and for the patient's long term health benefit, including to reduce Her cardiovascular risks longer-term.    We will pick up our discussion about the next steps and treatment options after testing.  We will keep them posted as to the test results by phone call and/or MyChart messaging where possible.  We will plan to follow-up in sleep clinic accordingly as well.  I answered all their questions today and the patient and her husband were in agreement.   I encouraged them to call with any interim questions, concerns, problems or updates or email us  through MyChart.  Generally speaking, sleep test authorizations may take up to 2 weeks, sometimes less, sometimes longer, the patient is encouraged to get in touch with us  if they do not hear back from the sleep lab staff directly within the next 2 weeks.  Thank you very much for allowing me to participate in the care of this nice patient. If I can be of any further assistance to you please do not hesitate to call me at [phone].  Sincerely,   Joy Fairy, MD, PhD

## 2023-12-21 ENCOUNTER — Telehealth: Payer: Self-pay | Admitting: Neurology

## 2023-12-21 NOTE — Telephone Encounter (Signed)
 [employer] Medicare/ BCBS fed no auth req.  Patient is scheduled at [facility] for 01/26/2024 at 9 pm.  Mailed packet to the patient.

## 2024-01-26 ENCOUNTER — Ambulatory Visit: Admitting: Neurology

## 2024-01-26 DIAGNOSIS — R0683 Snoring: Secondary | ICD-10-CM | POA: Diagnosis not present

## 2024-01-26 DIAGNOSIS — E66811 Obesity, class 1: Secondary | ICD-10-CM

## 2024-01-26 DIAGNOSIS — G4700 Insomnia, unspecified: Secondary | ICD-10-CM

## 2024-01-26 DIAGNOSIS — R351 Nocturia: Secondary | ICD-10-CM

## 2024-01-26 DIAGNOSIS — G4719 Other hypersomnia: Secondary | ICD-10-CM

## 2024-01-26 DIAGNOSIS — Z9189 Other specified personal risk factors, not elsewhere classified: Secondary | ICD-10-CM

## 2024-01-26 DIAGNOSIS — R058 Other specified cough: Secondary | ICD-10-CM

## 2024-01-26 DIAGNOSIS — G4720 Circadian rhythm sleep disorder, unspecified type: Secondary | ICD-10-CM

## 2024-02-04 NOTE — Procedures (Signed)
 Physician Interpretation:     [facility] POLYSOMNOGRAPHY  INTERPRETATION REPORT   STUDY DATE:  01/26/2024     PATIENT NAME:  Joy Cook         DATE OF BIRTH:  1965/12/10  PATIENT ID:  [id number]    TYPE OF STUDY:  PSG  READING PHYSICIAN: TRUE MAR, MD, PhD   SCORING TECHNICIAN: Donnice Counts, RPSGT     Referred by: Dr. Vaughan Ricker ? History and Indication for Testing: 58 year old female with an underlying complex medical history of arthritis, diverticulosis, reflux disease, anxiety, hyperthyroidism with status post radioactive iodine and subsequent hypothyroidism, hypertension, depression, mood disorder, asthma, allergies, and obesity, who reports snoring and excessive daytime somnolence as well as waking up with a sense of gasping for air and coughing at night. Her Epworth sleepiness score is 7 out of 24, fatigue severity score is 50 out of 63.  Height: 62 in Weight: 179 lb (BMI 32) Neck Size: 16 in    MEDICATIONS: Tylenol , Norvasc , Abilify , Lipitor, Zyrtec, Vitamin D3, Klonopin, Iron, Flonase , Flovent , Hydrodiuril , Xopenex, Narcan, Oxycodone , Trilafon, Synthroid  88, Kenalog, Wixela   TECHNICAL DESCRIPTION: A registered sleep technologist was in attendance for the duration of the recording.  Data collection, scoring, video monitoring, and reporting were performed in compliance with the AASM Manual for the Scoring of Sleep and Associated Events; (Hypopnea is scored based on the criteria listed in Section VIII D. 1b in the AASM Manual V2.6 using a 4% oxygen desaturation rule or Hypopnea is scored based on the criteria listed in Section VIII D. 1a in the AASM Manual V2.6 using 3% oxygen desaturation and /or arousal rule).   SLEEP CONTINUITY AND SLEEP ARCHITECTURE:  Lights-out was at 22:18: and lights-on at  04:55:, with a total recording time of 6 hours, 36.5 min. Total sleep time ( TST) was 239.5 minutes with a decreased sleep efficiency at 60.4%.  There was  3.8% REM sleep.    BODY POSITION:  TST was divided  between the following sleep positions: 61.4% supine;  38.6% lateral;  0% prone. Duration of total sleep and percent of total sleep in their respective position is as follows: supine 147 minutes (61%), non-supine 93 minutes (39%); right 26 minutes (11%), left 66 minutes (28%), and prone 00 minutes (0%).  Total supine REM sleep time was 09 minutes (100% of total REM sleep).  Sleep latency was increased at 56.5 minutes.  REM sleep latency was increased at 170.0 minutes. Of the total sleep time, the percentage of stage N1 sleep was 3.8%, stage N2 sleep was 88%, which is markedly increased, stage N3 sleep was 4.6%, which is reduced, and REM sleep was 3.8%, which is markedly reduced. Wake after sleep onset (WASO) time accounted for 100.5 minutes with mild to moderate sleep fragmentation noted.     RESPIRATORY MONITORING:    Based on CMS criteria (using a 4% oxygen desaturation rule for scoring hypopneas), there were 0 apneas (0 obstructive; 0 central; 0 mixed), and 9 hypopneas.  Apnea index was 0.0. Hypopnea index was 2.3. The apnea-hypopnea index was 2.3/hour overall (3.3 supine, 0 non-supine; 33.3 REM, 33.3 supine REM).  There were 0 respiratory effort-related arousals (RERAs).  The RERA index was 0 events/h. Total respiratory disturbance index (RDI) was 2.3 events/h. RDI results showed: supine RDI  3.3 /h; non-supine RDI 0.6 /h; REM RDI 33.3 /h, supine REM RDI 33.3 /h.   Based on AASM criteria (using a 3% oxygen desaturation and /or arousal rule  for scoring hypopneas), there were 0 apneas (0 obstructive; 0 central; 0 mixed), and 9 hypopneas. Apnea index was 0.0. Hypopnea index was 2.3. The apnea-hypopnea index was 2.3 overall (3.3 supine, 0 non-supine; 33.3 REM, 33.3 supine REM).  There were 0 respiratory effort-related arousals (RERAs).  The RERA index was 0 events/h. Total respiratory disturbance index (RDI) was 2.3 events/h. RDI results  showed: supine RDI  3.3 /h; non-supine RDI 0.6 /h; REM RDI 33.3 /h, supine REM RDI 33.3 /h.    OXIMETRY: Oxyhemoglobin Saturation Nadir during sleep was at  84% from a mean of 94%.  Of the Total sleep time (TST)   hypoxemia (=<88%) was present for  1.3 minutes, or 0.6% of total sleep time.   LIMB MOVEMENTS: There were 0 periodic limb movements of sleep (0.0/hr), of which 0 (0.0/hr) were associated with an arousal.   AROUSAL: There were 34 arousals in total, for an arousal index of 9 arousals/hour.  Of these, 1 were identified as respiratory-related arousals (0 /h), 0 were PLM-related arousals (0 /h), and 43 were non-specific arousals (11 /h).   EEG: Review of the EEG showed no abnormal electrical discharges and symmetrical bihemispheric findings.      EKG: The EKG revealed normal sinus rhythm (NSR). The average heart rate during sleep was 75 bpm.     AUDIO/VIDEO REVIEW: The audio and video review did not show any abnormal or unusual behaviors, movements, phonations or vocalizations. The patient took no restroom breaks. Snoring was noted, in the mild range.    POST-STUDY QUESTIONNAIRE: Post study, the patient indicated, that sleep was better than usual.   IMPRESSION:    1. Primary Snoring 2. Dysfunctions associated with sleep stages or arousal from sleep   RECOMMENDATIONS:    1. This study does not demonstrate any significant obstructive or central sleep disordered breathing with an AHI of less than 5/hour - her AHI was 2.3/hour - and oxygen desaturation nadir was 84% (during supine REM sleep) without any significant time below 89% saturation for the night (1.3 minutes).  Mild snoring was detected.  Treatment with a positive airway pressure device, such as CPAP or autoPAP is not indicated based on this study. Of note, the decrease sleep efficiency and total sleep time of nearly 4 hours and decreased REM percentage may have led to an underestimation of her sleep disordered breathing. Weight  loss and avoidance of the supine sleep position may aid in reducing her snoring.  2. This study shows sleep fragmentation and abnormal sleep stage percentages; these are nonspecific findings and per se do not signify an intrinsic sleep disorder or a cause for the patient's sleep-related symptoms. Causes include (but are not limited to) the first night effect of the sleep study, circadian rhythm disturbances, medication effect or an underlying mood disorder or medical problem.  3. The patient should be cautioned not to drive, work at heights, or operate dangerous or heavy equipment when tired or sleepy. Review and reiteration of good sleep hygiene measures should be pursued with any patient. 4. The patient will be advised to follow up with the referring provider, who will be notified of the test results.   I certify that I have reviewed the entire raw data recording prior to the issuance of this report in accordance with the Standards of Accreditation of the American Academy of Sleep Medicine (AASM).  True Mar, MD, PhD Medical Director, [facility]) Diplomat, ABPN (Neurology and Sleep)  Technical Report:   General Information  Name: Joy Cook, Joy Cook BMI: 32.74 Physician: TRUE MAR, MD  ID: [id number] Height: 62.0 in Technician: Donnice Counts, RPSGT  Sex: Female Weight: 179.0 lb Record: [id number]  Age: 64 [birth year]] Date: 01/26/2024    Medical & Medication History    Mrs. Lichtenberg is a 58 year old female with an underlying complex medical history of arthritis, diverticulosis, reflux disease, anxiety, hyperthyroidism with status post radioactive iodine and subsequent hypothyroidism, hypertension, depression, mood disorder, asthma, allergies, and obesity, who reports snoring and excessive daytime somnolence as well as waking up with a sense of gasping for air and coughing at night. Her Epworth sleepiness score is 7 out of 24,  fatigue severity score is 50 out of 63. She had further evaluation with a flexible laryngoscope. She was noted to have a normal pharynx and larynx. She has trouble falling asleep. She takes clonazepam at night for sleep, she is followed by psychiatry and is on Abilify  daily as well as perphenazine. She is followed by pain management and takes oxycodone  as needed. Bedtime is generally around 10 or 11 but she may not be asleep until 2 or 3 AM. Rise time is around 8 AM. She has nocturia once per average night, typically in the early morning hours. She has had weight gain over time. She lives with her husband, no pets in the household. She sleeps with a fan on and the TV on, on a sleep app or program. She cannot sleep in the dark. Her kids are grown. Tylenol , Norvasc , Abilify , Lipitor, Zyrtec, Vitamin D3, Klonopin, Iron, Flonase , Flovent , Hydrodiuril , Xopenex, Narcan, Oxycodone , Trilafon, Synthroid  88, Kenalog, Wixela   Sleep Disorder      Comments   Patient arrived for a diagnostic polysomnogram. Procedure explained and all questions answered. paste setup without complications. Patient slept supine, left, and right. Mild snoring was noted. Few occasional respiratory events observed. No obvious cardiac arrhythmias noted. No significant PLMS observed. No nocturia.    Lights out: 10:18:39 PM Lights on: 04:55:22 AM   Time Total Supine Side Prone Upright  Recording (TRT) 6h 36.7m 2h 59.60m 3h 33.54m 0h 3.65m 0h 0.47m  Sleep (TST) 3h 59.39m 2h 27.75m 1h 32.46m 0h 0.34m 0h 0.40m   Latency N1 N2 N3 REM Onset Per. Slp. Eff.  Actual 0h 0.33m 0h 1.31m 2h 29.36m 2h 50.17m 0h 56.75m 1h 15.67m 60.40%   Stg Dur Wake N1 N2 N3 REM  Total 157.0 9.0 210.5 11.0 9.0  Supine 32.0 3.5 123.5 11.0 9.0  Side 121.0 5.5 87.0 0.0 0.0  Prone 3.5 0.0 0.0 0.0 0.0  Upright 0.5 0.0 0.0 0.0 0.0   Stg % Wake N1 N2 N3 REM  Total 39.6 3.8 87.9 4.6 3.8  Supine 8.1 1.5 51.6 4.6 3.8  Side 30.5 2.3 36.3 0.0 0.0  Prone 0.9 0.0 0.0 0.0 0.0  Upright  0.1 0.0 0.0 0.0 0.0     Apnea Summary Sub Supine Side Prone Upright  Total 0 Total 0 0 0 0 0    REM 0 0 0 0 0    NREM 0 0 0 0 0  Obs 0 REM 0 0 0 0 0    NREM 0 0 0 0 0  Mix 0 REM 0 0 0 0 0    NREM 0 0 0 0 0  Cen 0 REM 0 0 0 0 0    NREM 0 0 0 0 0   Rera Summary Sub Supine Side Prone Upright  Total 0 Total  0 0 0 0 0    REM 0 0 0 0 0    NREM 0 0 0 0 0   Hypopnea Summary Sub Supine Side Prone Upright  Total 9 Total 9 8 1  0 0    REM 5 5 0 0 0    NREM 4 3 1  0 0   4% Hypopnea Summary Sub Supine Side Prone Upright  Total (4%) 9 Total 9 8 1  0 0    REM 5 5 0 0 0    NREM 4 3 1  0 0     AHI Total Obs Mix Cen  2.25 Apnea 0.00 0.00 0.00 0.00   Hypopnea 2.25 -- -- --  2.25 Hypopnea (4%) 2.25 -- -- --    Total Supine Side Prone Upright  Position AHI 2.25 3.27 0.65 0.00 0.00  REM AHI 33.33   NREM AHI 1.04   Position RDI 2.25 3.27 0.65 0.00 0.00  REM RDI 33.33   NREM RDI 1.04    4% Hypopnea Total Supine Side Prone Upright  Position AHI (4%) 2.25 3.27 0.65 0.00 0.00  REM AHI (4%) 33.33   NREM AHI (4%) 1.04   Position RDI (4%) 2.25 3.27 0.65 0.00 0.00  REM RDI (4%) 33.33   NREM RDI (4%) 1.04    Desaturation Information Threshold: 2% <100% <90% <80% <70% <60% <50% <40%  Supine 66.0 5.0 0.0 0.0 0.0 0.0 0.0  Side 44.0 2.0 0.0 0.0 0.0 0.0 0.0  Prone 0.0 0.0 0.0 0.0 0.0 0.0 0.0  Upright 0.0 0.0 0.0 0.0 0.0 0.0 0.0  Total 110.0 7.0 0.0 0.0 0.0 0.0 0.0  Index 19.9 1.3 0.0 0.0 0.0 0.0 0.0   Threshold: 3% <100% <90% <80% <70% <60% <50% <40%  Supine 29.0 5.0 0.0 0.0 0.0 0.0 0.0  Side 27.0 2.0 0.0 0.0 0.0 0.0 0.0  Prone 0.0 0.0 0.0 0.0 0.0 0.0 0.0  Upright 0.0 0.0 0.0 0.0 0.0 0.0 0.0  Total 56.0 7.0 0.0 0.0 0.0 0.0 0.0  Index 10.1 1.3 0.0 0.0 0.0 0.0 0.0   Threshold: 4% <100% <90% <80% <70% <60% <50% <40%  Supine 19.0 5.0 0.0 0.0 0.0 0.0 0.0  Side 20.0 2.0 0.0 0.0 0.0 0.0 0.0  Prone 0.0 0.0 0.0 0.0 0.0 0.0 0.0  Upright 0.0 0.0 0.0 0.0 0.0 0.0 0.0  Total 39.0 7.0 0.0 0.0 0.0 0.0  0.0  Index 7.0 1.3 0.0 0.0 0.0 0.0 0.0   Threshold: 3% <100% <90% <80% <70% <60% <50% <40%  Supine 29 5 0 0 0 0 0  Side 27 2 0 0 0 0 0  Prone 0 0 0 0 0 0 0  Upright 0 0 0 0 0 0 0  Total 56 7 0 0 0 0 0   Awakening/Arousal Information # of Awakenings 24  Wake after sleep onset 100.31m  Wake after persistent sleep 88.2m   Arousal Assoc. Arousals Index  Apneas 0 0.0  Hypopneas 1 0.3  Leg Movements 2 0.5  Snore 0 0.0  PTT Arousals 0 0.0  Spontaneous 44 11.0  Total 47 11.8  Leg Movement Information PLMS LMs Index  Total LMs during PLMS 0 0.0  LMs w/ Microarousals 0 0.0   LM LMs Index  w/ Microarousal 2 0.5  w/ Awakening 1 0.3  w/ Resp Event 0 0.0  Spontaneous 3 0.8  Total 5 1.3     Desaturation threshold setting: 3% Minimum desaturation setting: 10 seconds SaO2 nadir: 84% The longest  event was a 27 sec obstructive Hypopnea with a minimum SaO2 of 85%. The lowest SaO2 was 84% associated with a 15 sec obstructive Hypopnea. EKG Rates EKG Avg Max Min  Awake 79 99 62  Asleep 75 92 68  EKG Events: Tachycardia

## 2024-02-05 ENCOUNTER — Ambulatory Visit: Payer: Self-pay | Admitting: Neurology

## 2024-02-05 NOTE — Telephone Encounter (Signed)
 Pt  called in regards to not having access to My chart and would like call  regarding Results

## 2024-02-08 NOTE — Telephone Encounter (Signed)
 Spoke to patient gave sleep study results Gave Dr Buck recommendation. Pt states already have oral device from her dentist. Will make Dr Buck aware . Pt expressed understanding and thanked me for calling

## 2024-02-08 NOTE — Telephone Encounter (Signed)
-----   Message from True Mar sent at 02/05/2024 10:44 AM EDT ----- See MyChart message to patient, FYI.  ----- Message ----- From: Mar True, MD Sent: 02/05/2024  10:40 AM EDT To: True Mar, MD

## 2024-03-04 ENCOUNTER — Other Ambulatory Visit: Payer: Self-pay | Admitting: Family Medicine

## 2024-03-04 DIAGNOSIS — Z1231 Encounter for screening mammogram for malignant neoplasm of breast: Secondary | ICD-10-CM

## 2024-04-13 ENCOUNTER — Ambulatory Visit
Admission: RE | Admit: 2024-04-13 | Discharge: 2024-04-13 | Disposition: A | Discharge status: Home / Self Care | Source: Ambulatory Visit | Attending: Family Medicine | Admitting: Family Medicine

## 2024-04-13 DIAGNOSIS — Z1231 Encounter for screening mammogram for malignant neoplasm of breast: Secondary | ICD-10-CM

## 2024-04-22 ENCOUNTER — Other Ambulatory Visit: Payer: Self-pay | Admitting: Gastroenterology

## 2024-04-22 DIAGNOSIS — R109 Unspecified abdominal pain: Secondary | ICD-10-CM

## 2024-04-22 DIAGNOSIS — R140 Abdominal distension (gaseous): Secondary | ICD-10-CM

## 2024-04-28 ENCOUNTER — Inpatient Hospital Stay
Admission: RE | Admit: 2024-04-28 | Discharge: 2024-04-28 | Disposition: A | Discharge status: Home / Self Care | Source: Ambulatory Visit | Attending: Gastroenterology

## 2024-04-28 DIAGNOSIS — R109 Unspecified abdominal pain: Secondary | ICD-10-CM

## 2024-04-28 DIAGNOSIS — R140 Abdominal distension (gaseous): Secondary | ICD-10-CM

## 2024-04-28 MED ORDER — IOPAMIDOL (ISOVUE-370) INJECTION 76%
80.0000 mL | Freq: Once | INTRAVENOUS | Status: AC | PRN
  Administered 2024-04-28: 80 mL via INTRAVENOUS

## 2024-05-04 ENCOUNTER — Other Ambulatory Visit (HOSPITAL_COMMUNITY): Payer: Self-pay

## 2024-05-05 ENCOUNTER — Other Ambulatory Visit (HOSPITAL_COMMUNITY): Payer: Self-pay

## 2024-05-05 MED ORDER — LEVALBUTEROL HCL 0.63 MG/3ML IN NEBU: 0.6300 mg | INHALATION_SOLUTION | Freq: Three times a day (TID) | RESPIRATORY_TRACT | 2 refills | Status: AC | PRN

## 2024-05-05 MED ORDER — ARIPIPRAZOLE 30 MG PO TABS: 30.0000 mg | ORAL_TABLET | Freq: Every day | ORAL | 2 refills | Status: AC

## 2024-05-05 MED ORDER — ARIPIPRAZOLE 30 MG PO TABS
30.0000 mg | ORAL_TABLET | Freq: Every day | ORAL | 2 refills | Status: AC
Start: 2023-12-23 — End: ?
  Filled 2024-06-02: qty 30, 30d supply, fill #0

## 2024-05-05 MED ORDER — FLUCONAZOLE 100 MG PO TABS: ORAL_TABLET | ORAL | 1 refills | Status: AC

## 2024-05-05 MED ORDER — MUPIROCIN 2 % EX OINT: TOPICAL_OINTMENT | CUTANEOUS | 1 refills | Status: AC

## 2024-05-05 MED ORDER — PERPHENAZINE 8 MG PO TABS: ORAL_TABLET | ORAL | 2 refills | Status: AC

## 2024-05-05 MED ORDER — PERPHENAZINE 8 MG PO TABS
ORAL_TABLET | ORAL | 2 refills | Status: AC
  Filled 2024-06-02: qty 75, 30d supply, fill #0

## 2024-05-05 MED ORDER — LEVALBUTEROL TARTRATE 45 MCG/ACT IN AERO: 2.0000 | INHALATION_SPRAY | RESPIRATORY_TRACT | 2 refills | Status: AC | PRN

## 2024-06-02 ENCOUNTER — Other Ambulatory Visit (HOSPITAL_COMMUNITY): Payer: Self-pay

## 2024-06-03 ENCOUNTER — Other Ambulatory Visit (HOSPITAL_COMMUNITY): Payer: Self-pay

## 2024-06-06 ENCOUNTER — Other Ambulatory Visit (HOSPITAL_COMMUNITY): Payer: Self-pay

## 2024-06-06 MED ORDER — AMLODIPINE BESYLATE 5 MG PO TABS
5.0000 mg | ORAL_TABLET | Freq: Every day | ORAL | 1 refills | Status: AC
  Filled 2024-06-06 – 2024-06-07 (×2): qty 90, 90d supply, fill #0

## 2024-06-06 MED ORDER — HYDROCHLOROTHIAZIDE 25 MG PO TABS
25.0000 mg | ORAL_TABLET | Freq: Every day | ORAL | 1 refills | Status: AC
  Filled 2024-06-06 – 2024-06-07 (×2): qty 90, 90d supply, fill #0

## 2024-06-07 ENCOUNTER — Other Ambulatory Visit (HOSPITAL_COMMUNITY): Payer: Self-pay

## 2024-06-10 ENCOUNTER — Other Ambulatory Visit (HOSPITAL_COMMUNITY): Payer: Self-pay

## 2024-06-22 ENCOUNTER — Other Ambulatory Visit (HOSPITAL_COMMUNITY): Payer: Self-pay

## 2024-06-22 MED ORDER — CLONAZEPAM 0.5 MG PO TABS
0.5000 mg | ORAL_TABLET | Freq: Two times a day (BID) | ORAL | 2 refills | Status: AC
Start: 2024-06-22 — End: ?
  Filled 2024-06-22: qty 60, 30d supply, fill #0

## 2024-06-22 MED ORDER — PERPHENAZINE 8 MG PO TABS: ORAL_TABLET | ORAL | 2 refills | Status: AC

## 2024-06-22 MED ORDER — ARIPIPRAZOLE 30 MG PO TABS: 30.0000 mg | ORAL_TABLET | Freq: Every day | ORAL | 2 refills | Status: AC

## 2024-06-27 ENCOUNTER — Other Ambulatory Visit: Payer: Self-pay

## 2024-06-28 ENCOUNTER — Other Ambulatory Visit (HOSPITAL_COMMUNITY): Payer: Self-pay

## 2024-06-29 MED ORDER — ATORVASTATIN CALCIUM 20 MG PO TABS: 20.0000 mg | ORAL_TABLET | Freq: Every day | ORAL | 0 refills | Status: AC

## 2024-07-12 ENCOUNTER — Other Ambulatory Visit (HOSPITAL_COMMUNITY): Payer: Self-pay

## 2024-07-12 MED ORDER — FAMOTIDINE 40 MG PO TABS
40.0000 mg | ORAL_TABLET | Freq: Two times a day (BID) | ORAL | 3 refills | Status: AC
  Filled 2024-07-12: qty 180, 90d supply, fill #0

## 2024-07-13 ENCOUNTER — Other Ambulatory Visit (HOSPITAL_COMMUNITY): Payer: Self-pay

## 2024-07-18 ENCOUNTER — Other Ambulatory Visit: Payer: Self-pay

## 2024-07-18 ENCOUNTER — Other Ambulatory Visit (HOSPITAL_COMMUNITY): Payer: Self-pay

## 2024-07-18 MED ORDER — ATORVASTATIN CALCIUM 20 MG PO TABS
20.0000 mg | ORAL_TABLET | Freq: Every day | ORAL | 0 refills | Status: AC
  Filled 2024-07-18: qty 90, 90d supply, fill #0

## 2024-07-20 ENCOUNTER — Other Ambulatory Visit (HOSPITAL_COMMUNITY): Payer: Self-pay

## 2024-07-20 MED ORDER — PREDNISONE 10 MG PO TABS
ORAL_TABLET | ORAL | 0 refills | Status: AC
  Filled 2024-07-20: qty 21, 12d supply, fill #0

## 2024-07-25 ENCOUNTER — Other Ambulatory Visit: Payer: Self-pay

## 2024-07-25 ENCOUNTER — Other Ambulatory Visit (HOSPITAL_COMMUNITY): Payer: Self-pay

## 2024-07-25 MED ORDER — OXYCODONE HCL 10 MG PO TABS
10.0000 mg | ORAL_TABLET | Freq: Two times a day (BID) | ORAL | 0 refills | Status: AC | PRN
  Filled 2024-07-25 (×3): qty 14, 7d supply, fill #0

## 2024-07-25 MED ORDER — POLYETHYLENE GLYCOL 3350 17 GM/SCOOP PO POWD
17.0000 g | Freq: Two times a day (BID) | ORAL | 2 refills | Status: AC
  Filled 2024-07-25: qty 1020, 60d supply, fill #0
  Filled 2024-07-25: qty 1020, 30d supply, fill #0
  Filled 2024-07-25: qty 952, 28d supply, fill #0

## 2024-07-26 ENCOUNTER — Other Ambulatory Visit: Payer: Self-pay

## 2024-07-26 ENCOUNTER — Other Ambulatory Visit (HOSPITAL_COMMUNITY): Payer: Self-pay

## 2024-08-22 ENCOUNTER — Other Ambulatory Visit (HOSPITAL_COMMUNITY): Payer: Self-pay

## 2024-08-22 MED ORDER — OXYCODONE HCL 10 MG PO TABS
10.0000 mg | ORAL_TABLET | Freq: Two times a day (BID) | ORAL | 0 refills | Status: AC | PRN
  Filled 2024-08-22: qty 40, 20d supply, fill #0

## 2024-09-06 ENCOUNTER — Other Ambulatory Visit (HOSPITAL_COMMUNITY): Payer: Self-pay

## 2024-09-14 NOTE — Progress Notes (Unsigned)
 " Cardiology Office Note:    Date:  09/14/2024   ID:  Joy Cook, DOB 13-Jan-1966, MRN [id number]  PCP:  Cleotilde Planas, MD   [facility] Providers Cardiologist:  Oneil Parchment, MD { Click to update primary MD,subspecialty MD or APP then REFRESH:1}    Referring MD: Cleotilde Planas, MD       History of Present Illness:   Joy Cook is a 59 y.o. female with a hx of HTN, HLD, GERD presenting today for follow-up.  Patient had a stress test 10/2016 that was normal with no evidence of ischemia or infarct. Holter monitor at that time with infrequent PACs, few PVCs, no pausing. SB>>ST felt to be a normal monitor. Echo from 04/2020 with normal LVEF at 55-60% with no RWMA, G1DD, and no valvular disease. Coronary CTA 04/2020 showed no disease. Ca score 0.   Most recent follow-up with Dr. Parchment on 04/29/2023, patient complained of continued left-sided chest discomfort in the same location as a childhood skin lesion.  Did not appear to have cardiac etiology based on prior normal cardiac exams.  Considered to be possible residual discomfort from old skin lesion or potential nerve involvement.  ROS:   Please see the history of present illness.    *** All other systems reviewed and are negative.     Past Medical History:  Diagnosis Date   Arthritis    Complication of anesthesia    SLOW TO WAKE   Diverticulosis of colon    Family history of premature CAD    GAD (generalized anxiety disorder)    GERD (gastroesophageal reflux disease)    Hemorrhoids    History of diverticulitis of colon 2018   History of hyperthyroidism 2003   s/p RAI  08-05-2002   Hypertension    followed by pcp  and cardiology, dr lois moose---- (nuclear study 10-24-2016 in epic normal w/ nuclear ef 57%, CCTA 04-19-2020 in epic no obstruction calcium  score zero   IDA (iron deficiency anemia)    MDD (major depressive disorder)    Moderate persistent asthma    followed by pcp   Postablative hypothyroidism     followed by pcp--- post RAI 08-05-2002   Schizoaffective disorder (HCC)    w/ hx chronic auditory hallucinations   Sebaceous cyst    left upper back   Wears glasses     Past Surgical History:  Procedure Laterality Date   ABDOMINOPLASTY  2010   W/ UMBILICAL HERNIA REPAIR   BREAST LUMPECTOMY Right 1988   per pt benign   BREAST REDUCTION SURGERY Bilateral 1998   CESAREAN SECTION  x3  last one 1988   BILATERAL TUBAL LIGATION WITH LAST C/S   COLONOSCOPY  last one 2013   CYST EXCISION Left 01/03/2021   Procedure: EXCISIONAL BIOPSY OF SEBACEOUS CYST ON LEFT UPPER BACK;  Surgeon: Debby Hila, MD;  Location: [facility];  Service: General;  Laterality: Left;   DILATATION & CURRETTAGE/HYSTEROSCOPY WITH RESECTOCOPE N/A 05/03/2014   Procedure: DILATATION & CURETTAGE/HYSTEROSCOPY WITH RESECTOCOPE;  Surgeon: Hargis Paradise, MD;  Location: [facility];  Service: Gynecology;  Laterality: N/A;   KNEE ARTHROSCOPY Bilateral early 1990s   REDUCTION MAMMAPLASTY     SHOULDER ARTHROSCOPY Left late 1990s   SHOULDER ARTHROSCOPY Right    2023 [facility]    Current Medications: Active Medications[1]   Allergies:   Shellfish allergy, Codeine, Hydrocodone, and Tandem plus [[id number]]   Social History   Socioeconomic History   Marital status: Married  Spouse name: Virgil   Number of children: 4   Years of education: Not on file   Highest education level: Not on file  Occupational History   Not on file  Tobacco Use   Smoking status: Never   Smokeless tobacco: Never  Vaping Use   Vaping status: Never Used  Substance and Sexual Activity   Alcohol use: Never   Drug use: Never   Sexual activity: Yes  Other Topics Concern   Not on file  Social History Narrative   ** Merged History Encounter ** 4 Kids (blended family)   Caffiene 1/2 cup coffee daily   Live Lives with husband.   Retired from [employer].    Social Drivers of Health   Tobacco Use: Low Risk  (12/15/2023)   Patient History    Smoking Tobacco Use: Never    Smokeless Tobacco Use: Never    Passive Exposure: Not on file  Financial Resource Strain: Not on file  Food Insecurity: Not on file  Transportation Needs: Not on file  Physical Activity: Not on file  Stress: Not on file  Social Connections: Not on file  Depression (EYV7-0): Not on file  Alcohol Screen: Not on file  Housing: Not on file  Utilities: Not on file  Health Literacy: Not on file     Family History: The patient's ***family history includes Congenital heart disease in her brother; Heart attack in her maternal grandfather and maternal grandmother; Hypertension in her mother; Kidney disease in her brother.  EKGs/Labs/Other Studies Reviewed:    The following studies were reviewed today: ***      Recent Labs: No results found for requested labs within last 365 days.  Recent Lipid Panel    Component Value Date/Time   CHOL 129 11/28/2021 1057   TRIG 59 11/28/2021 1057   HDL 53 11/28/2021 1057   CHOLHDL 2.4 11/28/2021 1057   LDLCALC 63 11/28/2021 1057     Risk Assessment/Calculations:   {Does this patient have ATRIAL FIBRILLATION?:[phone]}  No BP recorded.  {Refresh Note OR Click here to enter BP  :1}***         Physical Exam:    VS:  LMP  (LMP Unknown) Comment: tubes tied       Wt Readings from Last 3 Encounters:  12/15/23 179 lb 12.8 oz (81.6 kg)  04/29/23 176 lb 6.4 oz (80 kg)  05/28/22 173 lb (78.5 kg)     GEN: *** Well nourished, well developed in no acute distress HEENT: Normal NECK: No JVD; No carotid bruits CARDIAC: *** S1-S2 normal, RRR, no murmurs, rubs, gallops RESPIRATORY:  Clear to auscultation without rales, wheezing or rhonchi  MUSCULOSKELETAL:  No edema; No deformity  SKIN: Warm and dry NEUROLOGIC:  Alert and oriented x 3 PSYCHIATRIC:  Normal affect       Assessment & Plan   Disposition: *** Route to primary cardiologist      {Are you ordering a CV Procedure  (e.g. stress test, cath, DCCV, TEE, etc)?   Press F2        :[id number]}    Medication Adjustments/Labs and Tests Ordered: Current medicines are reviewed at length with the patient today.  Concerns regarding medicines are outlined above.  No orders of the defined types were placed in this encounter.  No orders of the defined types were placed in this encounter.   There are no Patient Instructions on file for this visit.   Signed, Helia Haese E Myley Bahner, NP  09/14/2024 8:42 PM  [facility]    [1]  No outpatient medications have been marked as taking for the 09/15/24 encounter (Appointment) with Kendrick Haapala E, NP.   "

## 2024-09-15 ENCOUNTER — Ambulatory Visit: Admitting: Emergency Medicine
